# Patient Record
Sex: Female | Born: 1950 | Race: White | Hispanic: Yes | Marital: Married | Smoking: Never smoker
Health system: Southern US, Community
[De-identification: ages and names within clinical notes are randomized; demographics above are authoritative.]

## PROBLEM LIST (undated history)

## (undated) DIAGNOSIS — A048 Other specified bacterial intestinal infections: Secondary | ICD-10-CM

## (undated) DIAGNOSIS — R51 Headache: Secondary | ICD-10-CM

## (undated) DIAGNOSIS — E119 Type 2 diabetes mellitus without complications: Secondary | ICD-10-CM

## (undated) DIAGNOSIS — T4145XA Adverse effect of unspecified anesthetic, initial encounter: Secondary | ICD-10-CM

## (undated) DIAGNOSIS — T8859XA Other complications of anesthesia, initial encounter: Secondary | ICD-10-CM

## (undated) DIAGNOSIS — K219 Gastro-esophageal reflux disease without esophagitis: Secondary | ICD-10-CM

## (undated) DIAGNOSIS — M1990 Unspecified osteoarthritis, unspecified site: Secondary | ICD-10-CM

## (undated) DIAGNOSIS — I10 Essential (primary) hypertension: Secondary | ICD-10-CM

## (undated) DIAGNOSIS — Z9889 Other specified postprocedural states: Secondary | ICD-10-CM

## (undated) DIAGNOSIS — R112 Nausea with vomiting, unspecified: Secondary | ICD-10-CM

## (undated) DIAGNOSIS — G709 Myoneural disorder, unspecified: Secondary | ICD-10-CM

## (undated) HISTORY — PX: TONSILLECTOMY: SUR1361

## (undated) HISTORY — PX: LIPOMA EXCISION: SHX5283

## (undated) HISTORY — PX: CHOLECYSTECTOMY: SHX55

## (undated) HISTORY — PX: ANKLE ARTHROSCOPY: SUR85

## (undated) HISTORY — PX: SHOULDER ARTHROSCOPY: SHX128

## (undated) HISTORY — PX: ABDOMINAL HYSTERECTOMY: SHX81

## (undated) HISTORY — PX: APPENDECTOMY: SHX54

## (undated) HISTORY — PX: SPINAL FUSION: SHX223

---

## 1988-08-10 HISTORY — PX: FUNCTIONAL ENDOSCOPIC SINUS SURGERY: SUR616

## 2010-08-10 DIAGNOSIS — A048 Other specified bacterial intestinal infections: Secondary | ICD-10-CM

## 2010-08-10 HISTORY — DX: Other specified bacterial intestinal infections: A04.8

## 2011-05-07 DIAGNOSIS — D1730 Benign lipomatous neoplasm of skin and subcutaneous tissue of unspecified sites: Secondary | ICD-10-CM | POA: Insufficient documentation

## 2011-05-07 DIAGNOSIS — M549 Dorsalgia, unspecified: Secondary | ICD-10-CM | POA: Insufficient documentation

## 2011-05-07 DIAGNOSIS — J309 Allergic rhinitis, unspecified: Secondary | ICD-10-CM | POA: Insufficient documentation

## 2011-06-10 DIAGNOSIS — M5412 Radiculopathy, cervical region: Secondary | ICD-10-CM | POA: Insufficient documentation

## 2011-06-10 DIAGNOSIS — F40240 Claustrophobia: Secondary | ICD-10-CM | POA: Insufficient documentation

## 2011-06-10 DIAGNOSIS — N951 Menopausal and female climacteric states: Secondary | ICD-10-CM | POA: Insufficient documentation

## 2012-05-06 DIAGNOSIS — E1142 Type 2 diabetes mellitus with diabetic polyneuropathy: Secondary | ICD-10-CM | POA: Insufficient documentation

## 2013-03-22 DIAGNOSIS — R011 Cardiac murmur, unspecified: Secondary | ICD-10-CM | POA: Insufficient documentation

## 2013-03-22 DIAGNOSIS — I10 Essential (primary) hypertension: Secondary | ICD-10-CM | POA: Insufficient documentation

## 2013-09-01 ENCOUNTER — Other Ambulatory Visit: Payer: Self-pay | Admitting: Neurological Surgery

## 2013-09-04 ENCOUNTER — Encounter (HOSPITAL_COMMUNITY): Payer: Self-pay | Admitting: Pharmacy Technician

## 2013-09-04 ENCOUNTER — Other Ambulatory Visit: Payer: Self-pay | Admitting: Neurological Surgery

## 2013-09-06 NOTE — Pre-Procedure Instructions (Signed)
Vanessa Armstrong  09/06/2013   Your procedure is scheduled on:  Thursday, February 5.  Report to [employer] [facility]/ Entrance "A" at 5:30AM.  Call this number if you have problems the morning of surgery: [phone]   Remember:   Do not eat food or drink liquids after midnight Wednesday.   Take these medicines the morning of surgery with A SIP OF WATER: Take if needed:oxyCODONE-acetaminophen (PERCOCET/ROXICET)    Do not wear jewelry, make-up or nail polish.  Do not wear lotions, powders, or perfumes. You may wear deodorant.  Do not shave 48 hours prior to surgery.   Do not bring valuables to the hospital.  [facility] is not responsible for any belongings or valuables.               Contacts, dentures or bridgework may not be worn into surgery.  Leave suitcase in the car. After surgery it may be brought to your room.  For patients admitted to the hospital, discharge time is determined by your treatment team.                Special Instructions: Shower using CHG 2 nights before surgery and the night before surgery.  If you shower the day of surgery use CHG.  Use special wash - you have one bottle of CHG for all showers.  You should use approximately 1/3 of the bottle for each shower.   Please read over the following fact sheets that you were given: Pain Booklet, Coughing and Deep Breathing, Blood Transfusion Information and Surgical Site Infection Prevention

## 2013-09-07 ENCOUNTER — Encounter (HOSPITAL_COMMUNITY)
Admission: RE | Admit: 2013-09-07 | Discharge: 2013-09-07 | Disposition: A | Discharge status: Home / Self Care | Payer: Medicare Other | Source: Ambulatory Visit | Attending: Neurological Surgery | Admitting: Neurological Surgery

## 2013-09-07 ENCOUNTER — Encounter (HOSPITAL_COMMUNITY): Payer: Self-pay

## 2013-09-07 DIAGNOSIS — Z01818 Encounter for other preprocedural examination: Secondary | ICD-10-CM | POA: Insufficient documentation

## 2013-09-07 DIAGNOSIS — Z01810 Encounter for preprocedural cardiovascular examination: Secondary | ICD-10-CM | POA: Insufficient documentation

## 2013-09-07 DIAGNOSIS — Z01812 Encounter for preprocedural laboratory examination: Secondary | ICD-10-CM | POA: Insufficient documentation

## 2013-09-07 DIAGNOSIS — Z01811 Encounter for preprocedural respiratory examination: Secondary | ICD-10-CM | POA: Insufficient documentation

## 2013-09-07 HISTORY — DX: Adverse effect of unspecified anesthetic, initial encounter: T41.45XA

## 2013-09-07 HISTORY — DX: Headache: R51

## 2013-09-07 HISTORY — DX: Other complications of anesthesia, initial encounter: T88.59XA

## 2013-09-07 HISTORY — DX: Essential (primary) hypertension: I10

## 2013-09-07 HISTORY — DX: Nausea with vomiting, unspecified: R11.2

## 2013-09-07 HISTORY — DX: Other specified bacterial intestinal infections: A04.8

## 2013-09-07 HISTORY — DX: Unspecified osteoarthritis, unspecified site: M19.90

## 2013-09-07 HISTORY — DX: Other specified postprocedural states: Z98.890

## 2013-09-07 HISTORY — DX: Gastro-esophageal reflux disease without esophagitis: K21.9

## 2013-09-07 LAB — BASIC METABOLIC PANEL
BUN: 12 mg/dL (ref 6–23)
CALCIUM: 9.4 mg/dL (ref 8.4–10.5)
CO2: 24 meq/L (ref 19–32)
Chloride: 100 mEq/L (ref 96–112)
Creatinine, Ser: 0.5 mg/dL (ref 0.50–1.10)
GFR calc Af Amer: >90 mL/min (ref >90)
GLUCOSE: 194 mg/dL — AB (ref 70–99)
Potassium: 4.7 mEq/L (ref 3.7–5.3)
Sodium: 141 mEq/L (ref 137–147)

## 2013-09-07 LAB — CBC WITH DIFFERENTIAL/PLATELET
BASOS PCT: 0 % (ref 0–1)
Basophils Absolute: 0 10*3/uL (ref 0.0–0.1)
EOS PCT: 1 % (ref 0–5)
Eosinophils Absolute: 0.1 10*3/uL (ref 0.0–0.7)
HCT: 41.9 % (ref 36.0–46.0)
Hemoglobin: 15.2 g/dL — ABNORMAL HIGH (ref 12.0–15.0)
LYMPHS ABS: 2.5 10*3/uL (ref 0.7–4.0)
Lymphocytes Relative: 44 % (ref 12–46)
MCH: 29.6 pg (ref 26.0–34.0)
MCHC: 36.3 g/dL — ABNORMAL HIGH (ref 30.0–36.0)
MCV: 81.5 fL (ref 78.0–100.0)
MONOS PCT: 10 % (ref 3–12)
Monocytes Absolute: 0.6 10*3/uL (ref 0.1–1.0)
Neutro Abs: 2.6 10*3/uL (ref 1.7–7.7)
Neutrophils Relative %: 45 % (ref 43–77)
PLATELETS: 238 10*3/uL (ref 150–400)
RBC: 5.14 MIL/uL — ABNORMAL HIGH (ref 3.87–5.11)
RDW: 13.3 % (ref 11.5–15.5)
WBC: 5.8 10*3/uL (ref 4.0–10.5)

## 2013-09-07 LAB — SURGICAL PCR SCREEN
MRSA, PCR: NEGATIVE
Staphylococcus aureus: NEGATIVE

## 2013-09-07 LAB — PROTIME-INR
INR: 0.91 (ref 0.00–1.49)
PROTHROMBIN TIME: 12.1 s (ref 11.6–15.2)

## 2013-09-07 NOTE — Progress Notes (Signed)
Call to [facility] at Dr. Ronnald Ramp office, clarified which orders to use for preop.

## 2013-09-07 NOTE — Progress Notes (Addendum)
Pt. Reports that she was originally seen by Dr. Prince Rome  (cardiac) in [place] - long time ago & Had Holter Monitor, everything was fine. She was sent back to that grp. in the past yr. or two when she was started on anti-htn med., just to be reviewed again for good thorough care. She reports that she was switched to Dr. Andria Frames at that time , no changes to her plan of care.

## 2013-09-13 MED ORDER — VANCOMYCIN HCL 10 G IV SOLR
2000.0000 mg | INTRAVENOUS | Status: DC
  Filled 2013-09-13 (×2): qty 2000

## 2013-09-13 MED ORDER — DEXAMETHASONE SODIUM PHOSPHATE 10 MG/ML IJ SOLN
10.0000 mg | INTRAMUSCULAR | Status: DC
  Filled 2013-09-13: qty 1

## 2013-09-13 MED ORDER — DEXAMETHASONE SODIUM PHOSPHATE 10 MG/ML IJ SOLN
10.0000 mg | INTRAMUSCULAR | Status: AC
  Administered 2013-09-14: 10 mg via INTRAVENOUS

## 2013-09-13 MED ORDER — CEFAZOLIN SODIUM-DEXTROSE 2-3 GM-% IV SOLR
2.0000 g | INTRAVENOUS | Status: AC
  Administered 2013-09-14: 2 g via INTRAVENOUS
  Filled 2013-09-13: qty 50

## 2013-09-14 ENCOUNTER — Encounter (HOSPITAL_COMMUNITY)
Admission: RE | Disposition: A | Discharge status: Home / Self Care | Payer: Medicare Other | Source: Ambulatory Visit | Attending: Neurological Surgery

## 2013-09-14 ENCOUNTER — Inpatient Hospital Stay (HOSPITAL_COMMUNITY): Payer: Medicare Other

## 2013-09-14 ENCOUNTER — Inpatient Hospital Stay (HOSPITAL_COMMUNITY)
Admission: RE | Admit: 2013-09-14 | Discharge: 2013-09-15 | DRG: 460 | Disposition: A | Discharge status: Home / Self Care | Payer: Medicare Other | Source: Ambulatory Visit | Attending: Neurological Surgery | Admitting: Neurological Surgery

## 2013-09-14 ENCOUNTER — Encounter (HOSPITAL_COMMUNITY): Payer: Medicare Other | Admitting: Certified Registered Nurse Anesthetist

## 2013-09-14 ENCOUNTER — Inpatient Hospital Stay (HOSPITAL_COMMUNITY): Payer: Medicare Other | Admitting: Certified Registered Nurse Anesthetist

## 2013-09-14 ENCOUNTER — Encounter (HOSPITAL_COMMUNITY): Payer: Self-pay | Admitting: *Deleted

## 2013-09-14 DIAGNOSIS — Z79899 Other long term (current) drug therapy: Secondary | ICD-10-CM

## 2013-09-14 DIAGNOSIS — Z9089 Acquired absence of other organs: Secondary | ICD-10-CM

## 2013-09-14 DIAGNOSIS — G8918 Other acute postprocedural pain: Secondary | ICD-10-CM | POA: Diagnosis not present

## 2013-09-14 DIAGNOSIS — M47817 Spondylosis without myelopathy or radiculopathy, lumbosacral region: Principal | ICD-10-CM | POA: Diagnosis present

## 2013-09-14 DIAGNOSIS — K219 Gastro-esophageal reflux disease without esophagitis: Secondary | ICD-10-CM | POA: Diagnosis present

## 2013-09-14 DIAGNOSIS — I10 Essential (primary) hypertension: Secondary | ICD-10-CM | POA: Diagnosis present

## 2013-09-14 DIAGNOSIS — E119 Type 2 diabetes mellitus without complications: Secondary | ICD-10-CM | POA: Diagnosis present

## 2013-09-14 DIAGNOSIS — Z981 Arthrodesis status: Secondary | ICD-10-CM

## 2013-09-14 DIAGNOSIS — Z888 Allergy status to other drugs, medicaments and biological substances status: Secondary | ICD-10-CM

## 2013-09-14 HISTORY — PX: MAXIMUM ACCESS (MAS)POSTERIOR LUMBAR INTERBODY FUSION (PLIF) 2 LEVEL: SHX6369

## 2013-09-14 HISTORY — DX: Myoneural disorder, unspecified: G70.9

## 2013-09-14 HISTORY — DX: Type 2 diabetes mellitus without complications: E11.9

## 2013-09-14 LAB — GLUCOSE, CAPILLARY
GLUCOSE-CAPILLARY: 224 mg/dL — AB (ref 70–99)
Glucose-Capillary: 126 mg/dL — ABNORMAL HIGH (ref 70–99)
Glucose-Capillary: 255 mg/dL — ABNORMAL HIGH (ref 70–99)

## 2013-09-14 LAB — TYPE AND SCREEN
ABO/RH(D): B NEG
Antibody Screen: NEGATIVE

## 2013-09-14 LAB — ABO/RH: ABO/RH(D): B NEG

## 2013-09-14 SURGERY — FOR MAXIMUM ACCESS (MAS) POSTERIOR LUMBAR INTERBODY FUSION (PLIF) 2 LEVEL
Anesthesia: General | Site: Spine Lumbar

## 2013-09-14 MED ORDER — LIDOCAINE HCL (CARDIAC) 20 MG/ML IV SOLN
INTRAVENOUS | Status: AC
  Filled 2013-09-14: qty 10

## 2013-09-14 MED ORDER — LISINOPRIL-HYDROCHLOROTHIAZIDE 10-12.5 MG PO TABS: 0.5000 | ORAL_TABLET | Freq: Every day | ORAL | Status: DC

## 2013-09-14 MED ORDER — ACETAMINOPHEN 650 MG RE SUPP: 650.0000 mg | RECTAL | Status: DC | PRN

## 2013-09-14 MED ORDER — PROPOFOL 10 MG/ML IV BOLUS
INTRAVENOUS | Status: AC
  Filled 2013-09-14: qty 20

## 2013-09-14 MED ORDER — LISINOPRIL 5 MG PO TABS
5.0000 mg | ORAL_TABLET | Freq: Every day | ORAL | Status: DC
  Administered 2013-09-14: 5 mg via ORAL
  Filled 2013-09-14 (×2): qty 1

## 2013-09-14 MED ORDER — PROPOFOL 10 MG/ML IV BOLUS
INTRAVENOUS | Status: DC | PRN
  Administered 2013-09-14: 200 mg via INTRAVENOUS

## 2013-09-14 MED ORDER — SUCCINYLCHOLINE CHLORIDE 20 MG/ML IJ SOLN
INTRAMUSCULAR | Status: DC | PRN
  Administered 2013-09-14: 100 mg via INTRAVENOUS

## 2013-09-14 MED ORDER — SODIUM CHLORIDE 0.9 % IR SOLN
Status: DC | PRN
  Administered 2013-09-14: 09:00:00

## 2013-09-14 MED ORDER — GABAPENTIN 300 MG PO CAPS
300.0000 mg | ORAL_CAPSULE | Freq: Every day | ORAL | Status: DC
  Administered 2013-09-14: 300 mg via ORAL
  Filled 2013-09-14 (×2): qty 1

## 2013-09-14 MED ORDER — SENNA 8.6 MG PO TABS
1.0000 | ORAL_TABLET | Freq: Two times a day (BID) | ORAL | Status: DC
  Administered 2013-09-14: 8.6 mg via ORAL
  Filled 2013-09-14 (×3): qty 1

## 2013-09-14 MED ORDER — METHOCARBAMOL 500 MG PO TABS
ORAL_TABLET | ORAL | Status: AC
Start: 2013-09-14 — End: 2013-09-15
  Filled 2013-09-14: qty 1

## 2013-09-14 MED ORDER — BUPIVACAINE HCL (PF) 0.25 % IJ SOLN
INTRAMUSCULAR | Status: DC | PRN
  Administered 2013-09-14: 10 mL
  Administered 2013-09-14: 2 mL

## 2013-09-14 MED ORDER — METOCLOPRAMIDE HCL 5 MG/ML IJ SOLN
INTRAMUSCULAR | Status: AC
  Filled 2013-09-14: qty 2

## 2013-09-14 MED ORDER — FENTANYL CITRATE 0.05 MG/ML IJ SOLN
INTRAMUSCULAR | Status: AC
  Filled 2013-09-14: qty 5

## 2013-09-14 MED ORDER — POTASSIUM CHLORIDE IN NACL 20-0.9 MEQ/L-% IV SOLN
INTRAVENOUS | Status: DC
  Filled 2013-09-14 (×3): qty 1000

## 2013-09-14 MED ORDER — METFORMIN HCL ER 500 MG PO TB24
1000.0000 mg | ORAL_TABLET | Freq: Two times a day (BID) | ORAL | Status: DC
  Administered 2013-09-14 – 2013-09-15 (×2): 1000 mg via ORAL
  Filled 2013-09-14 (×4): qty 2

## 2013-09-14 MED ORDER — METOCLOPRAMIDE HCL 5 MG/ML IJ SOLN
INTRAMUSCULAR | Status: DC | PRN
  Administered 2013-09-14: 5 mg via INTRAVENOUS

## 2013-09-14 MED ORDER — SODIUM CHLORIDE 0.9 % IV SOLN
INTRAVENOUS | Status: DC | PRN
  Administered 2013-09-14: 12:00:00 via INTRAVENOUS

## 2013-09-14 MED ORDER — ARTIFICIAL TEARS OP OINT
TOPICAL_OINTMENT | OPHTHALMIC | Status: AC
  Filled 2013-09-14: qty 3.5

## 2013-09-14 MED ORDER — SODIUM CHLORIDE 0.9 % IJ SOLN: 3.0000 mL | INTRAMUSCULAR | Status: DC | PRN

## 2013-09-14 MED ORDER — LACTATED RINGERS IV SOLN
INTRAVENOUS | Status: DC | PRN
  Administered 2013-09-14 (×3): via INTRAVENOUS

## 2013-09-14 MED ORDER — SODIUM CHLORIDE 0.9 % IJ SOLN
3.0000 mL | Freq: Two times a day (BID) | INTRAMUSCULAR | Status: DC
  Administered 2013-09-14: 3 mL via INTRAVENOUS

## 2013-09-14 MED ORDER — HYDROMORPHONE HCL PF 1 MG/ML IJ SOLN
0.2500 mg | INTRAMUSCULAR | Status: DC | PRN
  Administered 2013-09-14 (×2): 0.5 mg via INTRAVENOUS

## 2013-09-14 MED ORDER — OXYCODONE-ACETAMINOPHEN 5-325 MG PO TABS
1.0000 | ORAL_TABLET | ORAL | Status: DC | PRN
  Administered 2013-09-14 (×2): 2 via ORAL
  Administered 2013-09-15 (×2): 1 via ORAL
  Filled 2013-09-14 (×2): qty 1
  Filled 2013-09-14 (×2): qty 2

## 2013-09-14 MED ORDER — MORPHINE SULFATE 2 MG/ML IJ SOLN
1.0000 mg | INTRAMUSCULAR | Status: DC | PRN
  Administered 2013-09-14: 2 mg via INTRAVENOUS
  Filled 2013-09-14: qty 1

## 2013-09-14 MED ORDER — ONDANSETRON HCL 4 MG/2ML IJ SOLN
INTRAMUSCULAR | Status: DC | PRN
  Administered 2013-09-14 (×2): 4 mg via INTRAVENOUS

## 2013-09-14 MED ORDER — THROMBIN 20000 UNITS EX SOLR
CUTANEOUS | Status: DC | PRN
  Administered 2013-09-14: 09:00:00 via TOPICAL

## 2013-09-14 MED ORDER — 0.9 % SODIUM CHLORIDE (POUR BTL) OPTIME
TOPICAL | Status: DC | PRN
  Administered 2013-09-14: 1000 mL

## 2013-09-14 MED ORDER — HYDROCHLOROTHIAZIDE 12.5 MG PO CAPS
12.5000 mg | ORAL_CAPSULE | Freq: Every day | ORAL | Status: DC
  Filled 2013-09-14: qty 1

## 2013-09-14 MED ORDER — METHOCARBAMOL 100 MG/ML IJ SOLN
500.0000 mg | Freq: Four times a day (QID) | INTRAMUSCULAR | Status: DC | PRN
  Filled 2013-09-14: qty 5

## 2013-09-14 MED ORDER — PROPOFOL INFUSION 10 MG/ML OPTIME
INTRAVENOUS | Status: DC | PRN
  Administered 2013-09-14: 75 ug/kg/min via INTRAVENOUS

## 2013-09-14 MED ORDER — MIDAZOLAM HCL 5 MG/5ML IJ SOLN
INTRAMUSCULAR | Status: DC | PRN
  Administered 2013-09-14: 2 mg via INTRAVENOUS

## 2013-09-14 MED ORDER — SODIUM CHLORIDE 0.9 % IV SOLN: 250.0000 mL | INTRAVENOUS | Status: DC

## 2013-09-14 MED ORDER — EPHEDRINE SULFATE 50 MG/ML IJ SOLN
INTRAMUSCULAR | Status: AC
  Filled 2013-09-14: qty 1

## 2013-09-14 MED ORDER — HYDROMORPHONE HCL PF 1 MG/ML IJ SOLN
INTRAMUSCULAR | Status: AC
  Filled 2013-09-14: qty 1

## 2013-09-14 MED ORDER — ONDANSETRON HCL 4 MG/2ML IJ SOLN
4.0000 mg | INTRAMUSCULAR | Status: DC | PRN
  Administered 2013-09-14: 4 mg via INTRAVENOUS
  Filled 2013-09-14: qty 2

## 2013-09-14 MED ORDER — ARTIFICIAL TEARS OP OINT
TOPICAL_OINTMENT | OPHTHALMIC | Status: DC | PRN
  Administered 2013-09-14: 1 via OPHTHALMIC

## 2013-09-14 MED ORDER — PHENOL 1.4 % MT LIQD
1.0000 | OROMUCOSAL | Status: DC | PRN
Start: 2013-09-14 — End: 2013-09-15

## 2013-09-14 MED ORDER — ACETAMINOPHEN 325 MG PO TABS: 650.0000 mg | ORAL_TABLET | ORAL | Status: DC | PRN

## 2013-09-14 MED ORDER — ONDANSETRON HCL 4 MG/2ML IJ SOLN
INTRAMUSCULAR | Status: AC
  Filled 2013-09-14: qty 2

## 2013-09-14 MED ORDER — THROMBIN 5000 UNITS EX SOLR
OROMUCOSAL | Status: DC | PRN
  Administered 2013-09-14: 09:00:00 via TOPICAL

## 2013-09-14 MED ORDER — CEFAZOLIN SODIUM 1-5 GM-% IV SOLN
1.0000 g | Freq: Three times a day (TID) | INTRAVENOUS | Status: AC
  Administered 2013-09-14 (×2): 1 g via INTRAVENOUS
  Filled 2013-09-14 (×2): qty 50

## 2013-09-14 MED ORDER — MENTHOL 3 MG MT LOZG
1.0000 | LOZENGE | OROMUCOSAL | Status: DC | PRN
Start: 2013-09-14 — End: 2013-09-15

## 2013-09-14 MED ORDER — LISINOPRIL 10 MG PO TABS
10.0000 mg | ORAL_TABLET | Freq: Every day | ORAL | Status: DC
  Filled 2013-09-14: qty 1

## 2013-09-14 MED ORDER — NITROGLYCERIN 0.4 MG SL SUBL: 0.4000 mg | SUBLINGUAL_TABLET | SUBLINGUAL | Status: DC | PRN

## 2013-09-14 MED ORDER — METHOCARBAMOL 500 MG PO TABS
500.0000 mg | ORAL_TABLET | Freq: Four times a day (QID) | ORAL | Status: DC | PRN
Start: 2013-09-14 — End: 2013-09-15
  Administered 2013-09-14 – 2013-09-15 (×3): 500 mg via ORAL
  Filled 2013-09-14 (×2): qty 1

## 2013-09-14 MED ORDER — ROCURONIUM BROMIDE 50 MG/5ML IV SOLN
INTRAVENOUS | Status: AC
  Filled 2013-09-14: qty 1

## 2013-09-14 MED ORDER — MIDAZOLAM HCL 2 MG/2ML IJ SOLN
INTRAMUSCULAR | Status: AC
  Filled 2013-09-14: qty 2

## 2013-09-14 MED ORDER — FENTANYL CITRATE 0.05 MG/ML IJ SOLN
INTRAMUSCULAR | Status: DC | PRN
  Administered 2013-09-14: 50 ug via INTRAVENOUS
  Administered 2013-09-14: 150 ug via INTRAVENOUS
  Administered 2013-09-14 (×4): 50 ug via INTRAVENOUS
  Administered 2013-09-14: 100 ug via INTRAVENOUS
  Administered 2013-09-14 (×2): 50 ug via INTRAVENOUS

## 2013-09-14 MED ORDER — ONDANSETRON HCL 4 MG/2ML IJ SOLN: 4.0000 mg | Freq: Once | INTRAMUSCULAR | Status: DC | PRN

## 2013-09-14 MED ORDER — LIDOCAINE HCL (CARDIAC) 20 MG/ML IV SOLN
INTRAVENOUS | Status: DC | PRN
  Administered 2013-09-14: 80 mg via INTRAVENOUS

## 2013-09-14 MED ORDER — HYDROCHLOROTHIAZIDE 10 MG/ML ORAL SUSPENSION
6.2500 mg | Freq: Every day | ORAL | Status: DC
  Administered 2013-09-14: 6.25 mg via ORAL
  Filled 2013-09-14 (×2): qty 1.25

## 2013-09-14 MED ORDER — STERILE WATER FOR INJECTION IJ SOLN
INTRAMUSCULAR | Status: AC
  Filled 2013-09-14: qty 10

## 2013-09-14 SURGICAL SUPPLY — 68 items
BAG DECANTER FOR FLEXI CONT (MISCELLANEOUS) ×3 IMPLANT
BENZOIN TINCTURE PRP APPL 2/3 (GAUZE/BANDAGES/DRESSINGS) ×3 IMPLANT
BLADE SURG ROTATE 9660 (MISCELLANEOUS) IMPLANT
BONE MATRIX OSTEOCEL PRO MED (Bone Implant) ×3 IMPLANT
BUR MATCHSTICK NEURO 3.0 LAGG (BURR) ×3 IMPLANT
CAGE COROENT LG 10X9X23-12 (Cage) ×6 IMPLANT
CAGE COROENT MP 8X23 (Cage) ×6 IMPLANT
CANISTER SUCT 3000ML (MISCELLANEOUS) ×3 IMPLANT
CLIP NEUROVISION LG (CLIP) ×3 IMPLANT
CLOSURE WOUND 1/2 X4 (GAUZE/BANDAGES/DRESSINGS) ×2
CONT SPEC 4OZ CLIKSEAL STRL BL (MISCELLANEOUS) ×6 IMPLANT
COVER BACK TABLE 24X17X13 BIG (DRAPES) IMPLANT
COVER TABLE BACK 60X90 (DRAPES) ×3 IMPLANT
DRAPE C-ARM 42X72 X-RAY (DRAPES) ×6 IMPLANT
DRAPE C-ARMOR (DRAPES) ×3 IMPLANT
DRAPE LAPAROTOMY 100X72X124 (DRAPES) ×3 IMPLANT
DRAPE POUCH INSTRU U-SHP 10X18 (DRAPES) ×3 IMPLANT
DRAPE SURG 17X23 STRL (DRAPES) ×3 IMPLANT
DRESSING TELFA 8X3 (GAUZE/BANDAGES/DRESSINGS) IMPLANT
DRSG OPSITE 4X5.5 SM (GAUZE/BANDAGES/DRESSINGS) ×3 IMPLANT
DRSG OPSITE POSTOP 4X8 (GAUZE/BANDAGES/DRESSINGS) ×3 IMPLANT
DURAPREP 26ML APPLICATOR (WOUND CARE) ×3 IMPLANT
ELECT REM PT RETURN 9FT ADLT (ELECTROSURGICAL) ×3
ELECTRODE REM PT RTRN 9FT ADLT (ELECTROSURGICAL) ×1 IMPLANT
EVACUATOR 1/8 PVC DRAIN (DRAIN) ×3 IMPLANT
GAUZE SPONGE 4X4 16PLY XRAY LF (GAUZE/BANDAGES/DRESSINGS) IMPLANT
GLOVE BIO SURGEON STRL SZ8 (GLOVE) ×9 IMPLANT
GLOVE BIO SURGEON STRL SZ8.5 (GLOVE) ×3 IMPLANT
GLOVE BIOGEL PI IND STRL 7.0 (GLOVE) ×2 IMPLANT
GLOVE BIOGEL PI INDICATOR 7.0 (GLOVE) ×4
GLOVE SS BIOGEL STRL SZ 6.5 (GLOVE) ×3 IMPLANT
GLOVE SS BIOGEL STRL SZ 8 (GLOVE) ×1 IMPLANT
GLOVE SUPERSENSE BIOGEL SZ 6.5 (GLOVE) ×6
GLOVE SUPERSENSE BIOGEL SZ 8 (GLOVE) ×2
GOWN BRE IMP SLV AUR LG STRL (GOWN DISPOSABLE) ×3 IMPLANT
GOWN BRE IMP SLV AUR XL STRL (GOWN DISPOSABLE) ×6 IMPLANT
GOWN STRL REIN 2XL LVL4 (GOWN DISPOSABLE) IMPLANT
HEMOSTAT POWDER KIT SURGIFOAM (HEMOSTASIS) ×3 IMPLANT
KIT BASIN OR (CUSTOM PROCEDURE TRAY) ×3 IMPLANT
KIT NEEDLE NVM5 EMG ELECT (KITS) ×1 IMPLANT
KIT NEEDLE NVM5 EMG ELECTRODE (KITS) ×2
KIT ROOM TURNOVER OR (KITS) ×3 IMPLANT
MILL MEDIUM DISP (BLADE) ×3 IMPLANT
NEEDLE HYPO 25X1 1.5 SAFETY (NEEDLE) ×3 IMPLANT
NS IRRIG 1000ML POUR BTL (IV SOLUTION) ×3 IMPLANT
PACK LAMINECTOMY NEURO (CUSTOM PROCEDURE TRAY) ×3 IMPLANT
PAD ARMBOARD 7.5X6 YLW CONV (MISCELLANEOUS) ×15 IMPLANT
ROD 60MM LUMBAR (Rod) ×6 IMPLANT
SCREW LOCK (Screw) ×12 IMPLANT
SCREW LOCK FXNS SPNE MAS PL (Screw) ×6 IMPLANT
SCREW MAS PLIF 5.5X30 (Screw) ×4 IMPLANT
SCREW PAS PLIF 5X30 (Screw) ×2 IMPLANT
SCREW SHANK 5.0X30MM (Screw) ×6 IMPLANT
SCREW SHANK 6.5X65 (Screw) ×3 IMPLANT
SCREW TULIP 5.5 (Screw) ×12 IMPLANT
SPONGE LAP 4X18 X RAY DECT (DISPOSABLE) IMPLANT
SPONGE SURGIFOAM ABS GEL 100 (HEMOSTASIS) ×3 IMPLANT
STRIP CLOSURE SKIN 1/2X4 (GAUZE/BANDAGES/DRESSINGS) ×4 IMPLANT
SUT VIC AB 0 CT1 18XCR BRD8 (SUTURE) ×1 IMPLANT
SUT VIC AB 0 CT1 8-18 (SUTURE) ×2
SUT VIC AB 2-0 CP2 18 (SUTURE) ×3 IMPLANT
SUT VIC AB 3-0 SH 8-18 (SUTURE) ×6 IMPLANT
SYR 20ML ECCENTRIC (SYRINGE) ×3 IMPLANT
SYR 3ML LL SCALE MARK (SYRINGE) IMPLANT
TOWEL OR 17X24 6PK STRL BLUE (TOWEL DISPOSABLE) ×3 IMPLANT
TOWEL OR 17X26 10 PK STRL BLUE (TOWEL DISPOSABLE) ×3 IMPLANT
TRAY FOLEY CATH 14FRSI W/METER (CATHETERS) ×3 IMPLANT
WATER STERILE IRR 1000ML POUR (IV SOLUTION) ×3 IMPLANT

## 2013-09-14 NOTE — Progress Notes (Signed)
Utilization review completed.  

## 2013-09-14 NOTE — Anesthesia Procedure Notes (Signed)
Procedure Name: Intubation Date/Time: 09/14/2013 7:48 AM Performed by: [place] Pink Pre-anesthesia Checklist: Patient identified, Timeout performed, Emergency Drugs available, Suction available and Patient being monitored Patient Re-evaluated:Patient Re-evaluated prior to inductionOxygen Delivery Method: Circle system utilized Preoxygenation: Pre-oxygenation with 100% oxygen Intubation Type: IV induction Ventilation: Mask ventilation without difficulty and Oral airway inserted - appropriate to patient size Laryngoscope Size: Mac and 3 Grade View: Grade II Tube type: Oral Tube size: 7.5 mm Number of attempts: 1 Airway Equipment and Method: Stylet and LTA kit utilized Placement Confirmation: ETT inserted through vocal cords under direct vision,  positive ETCO2 and breath sounds checked- equal and bilateral Secured at: 23 cm Tube secured with: Tape Dental Injury: Teeth and Oropharynx as per pre-operative assessment

## 2013-09-14 NOTE — H&P (Signed)
Subjective: Patient is a 63 y.o. female admitted for PLIF L3-4, L4-5. Onset of symptoms was several months ago, gradually worsening since that time.  The pain is rated severe, and is located at the across the lower back and radiates to legs. The pain is described as aching and occurs all day. The symptoms have been progressive. Symptoms are exacerbated by exercise. MRI or CT showed stenosis L4-5, L5-S1. Previous surgery x2 at L5-S1.   Past Medical History  Diagnosis Date  . Complication of anesthesia     woke up slowly- but also terrible nausea, pt. responed   well tio PHENERGAN   . PONV (postoperative nausea and vomiting)   . Hypertension   . GERD (gastroesophageal reflux disease)   . Headache(784.0)     sinus headaches, also reports that headaaches are much impriocved since cervical fusion in 2014   . Arthritis     DDD  . H. pylori infection 2012    treated as outpatient   . Neuromuscular disorder     neuropathy in feet  . Diabetes mellitus without complication     Past Surgical History  Procedure Laterality Date  . Tonsillectomy    . Functional endoscopic sinus surgery  1990  . Appendectomy    . Cholecystectomy    . Abdominal hysterectomy    . Ankle arthroscopy      for bone spurs   . Shoulder arthroscopy Right     for RCT  . Spinal fusion  2008-2014    multiple surgeries- cervical & lumbar , last one- cervical fusion - 2014  . Lipoma excision      multiple - abdominal region    Prior to Admission medications   Medication Sig Start Date End Date Taking? Authorizing Provider  cetirizine (ZYRTEC) 10 MG tablet Take 10 mg by mouth daily as needed for allergies.   Yes Historical Provider, MD  gabapentin (NEURONTIN) 300 MG capsule Take 300 mg by mouth at bedtime.   Yes Historical Provider, MD  lisinopril-hydrochlorothiazide (PRINZIDE,ZESTORETIC) 10-12.5 MG per tablet Take 0.5 tablets by mouth daily.   Yes Historical Provider, MD  metFORMIN (GLUCOPHAGE-XR) 500 MG 24 hr tablet  Take 1,000 mg by mouth 2 (two) times daily.   Yes Historical Provider, MD  Multiple Vitamin (MULTIVITAMIN WITH MINERALS) TABS tablet Take 1 tablet by mouth daily.   Yes Historical Provider, MD  nitroGLYCERIN (NITROSTAT) 0.4 MG SL tablet Place 0.4 mg under the tongue every 5 (five) minutes as needed for chest pain.   Yes Historical Provider, MD  omeprazole (PRILOSEC) 20 MG capsule Take 20 mg by mouth 2 (two) times daily before a meal.   Yes Historical Provider, MD  oxyCODONE-acetaminophen (PERCOCET/ROXICET) 5-325 MG per tablet Take 1 tablet by mouth 4 (four) times daily as needed for severe pain.   Yes Historical Provider, MD  oxyCODONE-acetaminophen (PERCOCET/ROXICET) 5-325 MG per tablet Take by mouth every 4 (four) hours as needed for severe pain (pt. taking 1/2 tab  of 10-325mg ).    Historical Provider, MD   Allergies  Allergen Reactions  . Ambien [Zolpidem] Other (See Comments)    delusions  . Flector [Diclofenac] Other (See Comments)    Patch  -  Headache and dizziness  . Prevpac [Amoxicill-Clarithro-Lansopraz] Rash and Other (See Comments)    Headache    -  Can take medication individual but not combined     History  Substance Use Topics  . Smoking status: Never Smoker   . Smokeless tobacco: Not on file  .  Alcohol Use: Yes     Comment: occasional glass of wine     No family history on file.   Review of Systems  Positive ROS: neg  All other systems have been reviewed and were otherwise negative with the exception of those mentioned in the HPI and as above.  Objective: Vital signs in last 24 hours: Temp:  [98.8 F (37.1 C)] 98.8 F (37.1 C) (02/05 0556) Pulse Rate:  [82] 82 (02/05 0556) Resp:  [16] 16 (02/05 0556) BP: (134)/(63) 134/63 mmHg (02/05 0556) SpO2:  [99 %] 99 % (02/05 0556)  General Appearance: Alert, cooperative, no distress, appears stated age Head: Normocephalic, without obvious abnormality, atraumatic Eyes: PERRL, conjunctiva/corneas clear, EOM's intact     Neck: Supple, symmetrical, trachea midline Back: Symmetric, no curvature, ROM normal, no CVA tenderness Lungs:  respirations unlabored Heart: Regular rate and rhythm Abdomen: Soft, non-tender Extremities: Extremities normal, atraumatic, no cyanosis or edema Pulses: 2+ and symmetric all extremities Skin: Skin color, texture, turgor normal, no rashes or lesions  NEUROLOGIC:   Mental status: Alert and oriented x4,  no aphasia, good attention span, fund of knowledge, and memory Motor Exam - grossly normal Sensory Exam - grossly normal Reflexes: 1+ Coordination - grossly normal Gait - grossly normal Balance - grossly normal Cranial Nerves: I: smell Not tested  II: visual acuity  OS: nl    OD: nl  II: visual fields Full to confrontation  II: pupils Equal, round, reactive to light  III,VII: ptosis None  III,IV,VI: extraocular muscles  Full ROM  V: mastication Normal  V: facial light touch sensation  Normal  V,VII: corneal reflex  Present  VII: facial muscle function - upper  Normal  VII: facial muscle function - lower Normal  VIII: hearing Not tested  IX: soft palate elevation  Normal  IX,X: gag reflex Present  XI: trapezius strength  5/5  XI: sternocleidomastoid strength 5/5  XI: neck flexion strength  5/5  XII: tongue strength  Normal    Data Review Lab Results  Component Value Date   WBC 5.8 09/07/2013   HGB 15.2* 09/07/2013   HCT 41.9 09/07/2013   MCV 81.5 09/07/2013   PLT 238 09/07/2013   Lab Results  Component Value Date   NA 141 09/07/2013   K 4.7 09/07/2013   CL 100 09/07/2013   CO2 24 09/07/2013   BUN 12 09/07/2013   CREATININE 0.50 09/07/2013   GLUCOSE 194* 09/07/2013   Lab Results  Component Value Date   INR 0.91 09/07/2013    Assessment/Plan: Patient admitted for PLIF L4-5, L5-S1. Patient has failed a reasonable attempt at conservative therapy.  I explained the condition and procedure to the patient and answered any questions.  Patient wishes to proceed  with procedure as planned. Understands risks/ benefits and typical outcomes of procedure.   Nayelli Inglis S 09/14/2013 7:41 AM

## 2013-09-14 NOTE — Progress Notes (Signed)
Going to lunch. Giving report to [place] [employer] RN

## 2013-09-14 NOTE — Progress Notes (Signed)
Lunch relief by MA Aleiah Mohammed RN 

## 2013-09-14 NOTE — Transfer of Care (Signed)
Immediate Anesthesia Transfer of Care Note  Patient: Vanessa Armstrong  Procedure(s) Performed: Procedure(s): FOR MAXIMUM ACCESS (MAS) POSTERIOR LUMBAR INTERBODY FUSION (PLIF) 2 LEVEL lumbar L4-5 L5-S1 (N/A)  Patient Location: PACU  Anesthesia Type:General  Level of Consciousness: awake, alert  and oriented  Airway & Oxygen Therapy: Patient Spontanous Breathing and Patient connected to nasal cannula oxygen  Post-op Assessment: Report given to PACU RN, Post -op Vital signs reviewed and stable and Patient moving all extremities X 4  Post vital signs: Reviewed and stable  Complications: No apparent anesthesia complications

## 2013-09-14 NOTE — Op Note (Signed)
09/14/2013  11:48 AM  PATIENT:  Vanessa Armstrong  63 y.o. female  PRE-OPERATIVE DIAGNOSIS:  1. Lumbar degenerative disc disease, 2. Lumbar spinal stenosis, 3. Failed back syndrome, 4. Back and leg pain  POST-OPERATIVE DIAGNOSIS:  Same  PROCEDURE:   1. Decompressive lumbar laminectomy L4-5 and L5-S1 requiring more work than would be required for a simple exposure of the disk for PLIF in order to adequately decompress the neural elements and address the spinal stenosis 2. Posterior lumbar interbody fusion L4-5 and L5-S1 using PEEK interbody cages packed with morcellized allograft and autograft 3. Posterior fixation L4-S1 inclusive using cortical pedicle screws.    SURGEON:  Sherley Bounds, MD  ASSISTANTS: Dr. Arnoldo Morale  ANESTHESIA:  General  EBL: 450 ml  Total I/O In: 2300 [I.V.:2150; Blood:150] Out: 88 [Urine:60; Blood:450]  BLOOD ADMINISTERED:150 CC PRBC  DRAINS: Hemovac   INDICATION FOR PROCEDURE: This patient presented with severe back and leg pain. It had 2 previous spine surgeries at L5-S1. MRI showed stenosis at L4-5 and recurrent disease L5-S1. I commended a 2 level decompression and fusion. Patient understood the risks, benefits, and alternatives and potential outcomes and wished to proceed.  PROCEDURE DETAILS:  The patient was brought to the operating room. After induction of generalized endotracheal anesthesia the patient was rolled into the prone position on chest rolls and all pressure points were padded. The patient's lumbar region was cleaned and then prepped with DuraPrep and draped in the usual sterile fashion. Anesthesia was injected and then a dorsal midline incision was made and carried down to the lumbosacral fascia. The fascia was opened and the paraspinous musculature was taken down in a subperiosteal fashion to expose L4-5 and L5-S1. A self-retaining retractor was placed. Intraoperative fluoroscopy confirmed my level, and I started with placement of the L4 cortical  pedicle screws. The pedicle screw entry zones were identified utilizing surface landmarks and  AP and lateral fluoroscopy. I scored the cortex with the high-speed drill and then used the hand drill and EMG monitoring to drill an upward and outward direction into the pedicle. I then tapped line to line, and the tap was also monitored. I then placed a 5-0 x 30 mm cortical pedicle screw into the pedicles of L4 bilaterally. I then turned my attention to the decompression and the spinous process was removed and complete lumbar laminectomies, hemi- facetectomies, and foraminotomies were performed at L45 and L5-S1. The patient had significant spinal stenosis and this required more work than would be required for a simple exposure of the disc for posterior lumbar interbody fusion. Much more generous decompression was undertaken in order to adequately decompress the neural elements and address the patient's leg pain. The yellow ligament was removed to expose the underlying dura and nerve roots, and generous foraminotomies were performed to adequately decompress the neural elements. There was significant scar tissue at L5-S1 on the right from the previous surgeries. . Or significant overgrown facet at both levels. Both the exiting and traversing nerve roots were decompressed on both sides until a coronary dilator passed easily along the nerve roots. Once the decompression was complete, I turned my attention to the posterior lower lumbar interbody fusion. The epidural venous vasculature was coagulated and cut sharply. Disc space was incised and the initial discectomy was performed with pituitary rongeurs. The disc space was distracted with sequential distractors to a height of 8 mm L5-S1 and 10 mm at L4-5. We then used a series of scrapers and shavers to prepare the endplates for  fusion at both levels. The midline was prepared with Epstein curettes. Once the complete discectomy was finished, we packed an appropriate sized peek  interbody cage with local autograft and morcellized allograft, gently retracted the nerve root, and tapped the cage into position at L4-5 and L5-S1.  The midline between the cages was packed with morselized autograft and allograft. We then turned our attention to the placement of the lower pedicle screws. The pedicle screw entry zones were identified utilizing surface landmarks and fluoroscopy. I drilled into each pedicle utilizing the hand drill and EMG monitoring, and tapped each pedicle with the appropriate tap. We palpated with a ball probe to assure no break in the cortex. We then placed 5-0 by 30 mm pedicle screws into the pedicles bilaterally at L5,  And 6 5 x 35 mm sacral pedicle screws .  We then placed lordotic rods into the multiaxial screw heads of the pedicle screws and locked these in position with the locking caps and anti-torque device. We then checked our construct with AP and lateral fluoroscopy. Irrigated with copious amounts of bacitracin-containing saline solution. Placed a medium Hemovac drain through separate stab incision. Inspected the nerve roots once again to assure adequate decompression, lined to the dura with Gelfoam, and closed the muscle and the fascia with 0 Vicryl. Closed the subcutaneous tissues with 2-0 Vicryl and subcuticular tissues with 3-0 Vicryl. The skin was closed with benzoin and Steri-Strips. Dressing was then applied, the patient was awakened from general anesthesia and transported to the recovery room in stable condition. At the end of the procedure all sponge, needle and instrument counts were correct.   PLAN OF CARE: Admit to inpatient   PATIENT DISPOSITION:  PACU - hemodynamically stable.   Delay start of Pharmacological VTE agent (>24hrs) due to surgical blood loss or risk of bleeding:  yes

## 2013-09-14 NOTE — Anesthesia Postprocedure Evaluation (Signed)
  Anesthesia Post-op Note  Patient: Vanessa Armstrong  Procedure(s) Performed: Procedure(s): FOR MAXIMUM ACCESS (MAS) POSTERIOR LUMBAR INTERBODY FUSION (PLIF) 2 LEVEL lumbar L4-5 L5-S1 (N/A)  Patient Location: PACU  Anesthesia Type:General  Level of Consciousness: awake, oriented, sedated and patient cooperative  Airway and Oxygen Therapy: Patient Spontanous Breathing  Post-op Pain: moderate  Post-op Assessment: Post-op Vital signs reviewed, Patient's Cardiovascular Status Stable, Respiratory Function Stable, Patent Airway, No signs of Nausea or vomiting and Pain level controlled  Post-op Vital Signs: stable  Complications: No apparent anesthesia complications

## 2013-09-14 NOTE — Anesthesia Preprocedure Evaluation (Signed)
Anesthesia Evaluation  Patient identified by MRN, date of birth, ID band Patient awake    Reviewed: Allergy & Precautions, H&P , NPO status , Patient's Chart, lab work & pertinent test results  Airway       Dental   Pulmonary          Cardiovascular hypertension,     Neuro/Psych  Headaches,    GI/Hepatic GERD-  ,  Endo/Other  diabetes, Type 2, Oral Hypoglycemic Agents  Renal/GU      Musculoskeletal   Abdominal   Peds  Hematology   Anesthesia Other Findings   Reproductive/Obstetrics                           Anesthesia Physical Anesthesia Plan  ASA: II  Anesthesia Plan: General   Post-op Pain Management:    Induction: Intravenous  Airway Management Planned: Oral ETT  Additional Equipment:   Intra-op Plan:   Post-operative Plan: Extubation in OR  Informed Consent: I have reviewed the patients History and Physical, chart, labs and discussed the procedure including the risks, benefits and alternatives for the proposed anesthesia with the patient or authorized representative who has indicated his/her understanding and acceptance.     Plan Discussed with:   Anesthesia Plan Comments:         Anesthesia Quick Evaluation

## 2013-09-14 NOTE — Preoperative (Signed)
Beta Blockers   Reason not to administer Beta Blockers:Not Applicable 

## 2013-09-15 LAB — GLUCOSE, CAPILLARY: GLUCOSE-CAPILLARY: 156 mg/dL — AB (ref 70–99)

## 2013-09-15 MED ORDER — OXYCODONE-ACETAMINOPHEN 5-325 MG PO TABS: 1.0000 | ORAL_TABLET | ORAL | Status: DC | PRN

## 2013-09-15 MED ORDER — METHOCARBAMOL 500 MG PO TABS: 500.0000 mg | ORAL_TABLET | Freq: Four times a day (QID) | ORAL | Status: DC | PRN

## 2013-09-15 MED FILL — Heparin Sodium (Porcine) Inj 1000 Unit/ML: INTRAMUSCULAR | Qty: 30 | Status: AC

## 2013-09-15 MED FILL — Sodium Chloride IV Soln 0.9%: INTRAVENOUS | Qty: 2000 | Status: AC

## 2013-09-15 NOTE — Progress Notes (Signed)
Pt doing very well. Pt given D/C instructions with Rx's verbal understanding was given. Pt D/C'd home via wheelchair @ 1120 per MD order. Pt was stable @ D/C and had no other needs. Holli Humbles, RN

## 2013-09-15 NOTE — Discharge Summary (Signed)
Physician Discharge Summary  Patient ID: Vanessa Armstrong MRN: [id number] DOB/AGE: 09-Jan-1951 63 y.o.  Admit date: 09/14/2013 Discharge date: 09/15/2013  Admission Diagnoses: Lumbar spondylosis and stenosis, status post previous L5-S1 discectomy    Discharge Diagnoses: Same   Discharged Condition: good  Hospital Course: The patient was admitted on 09/14/2013 and taken to the operating room where the patient underwent PLIF L4-5 L5-S1. The patient tolerated the procedure well and was taken to the recovery room and then to the floor in stable condition. The hospital course was routine. There were no complications. The wound remained clean dry and intact. Pt had appropriate back soreness. No complaints of leg pain or new N/T/W. The patient remained afebrile with stable vital signs, and tolerated a regular diet. The patient continued to increase activities, and pain was well controlled with oral pain medications.   Consults: None  Significant Diagnostic Studies:  Results for orders placed during the hospital encounter of 09/14/13  GLUCOSE, CAPILLARY      Result Value Range   Glucose-Capillary 126 (*) 70 - 99 mg/dL  GLUCOSE, CAPILLARY      Result Value Range   Glucose-Capillary 224 (*) 70 - 99 mg/dL   Comment 1 Notify RN     Comment 2 Documented in Chart    GLUCOSE, CAPILLARY      Result Value Range   Glucose-Capillary 255 (*) 70 - 99 mg/dL  GLUCOSE, CAPILLARY      Result Value Range   Glucose-Capillary 156 (*) 70 - 99 mg/dL   Comment 1 Notify RN     Comment 2 Documented in Chart    TYPE AND SCREEN      Result Value Range   ABO/RH(D) B NEG     Antibody Screen NEG     Sample Expiration 09/17/2013    ABO/RH      Result Value Range   ABO/RH(D) B NEG      Chest 2 View  09/07/2013   CLINICAL DATA:  Spondylosis, preoperative evaluation.  EXAM: CHEST  2 VIEW  COMPARISON:  None available for comparison at time of study interpretation.  FINDINGS: Cardiomediastinal silhouette is  unremarkable. The lungs are clear without pleural effusions or focal consolidations. Pulmonary vasculature is unremarkable. Trachea projects midline and there is no pneumothorax. Soft tissue planes and included osseous structures are nonsuspicious. Mild degenerative change of the thoracic spine. Status post ACDF. Surgical clips in the abdomen likely reflect cholecystectomy.  IMPRESSION: No acute cardiopulmonary process.   Electronically Signed   By: Awilda Metroourtnay  Bloomer   On: 09/07/2013 12:53   Dg Lumbar Spine 2-3 Views  09/14/2013   CLINICAL DATA:  L4-S1 MAS PLIF.  EXAM: LUMBAR SPINE - 2-3 VIEW  COMPARISON:  Images from outside lumbar MRI [facility] 07[facility]18/2013. No report available.  FINDINGS: Spot PA and lateral fluoroscopic images of the lower lumbar spine are submitted. Five lumbar type vertebral bodies are assumed. These images demonstrate L4 through S1 laminectomy, pedicle screw and interbody fusion. The hardware appears well positioned. No complications are identified.  IMPRESSION: No demonstrated complication following lower lumbar fusion.   Electronically Signed   By: Roxy HorsemanBill  Veazey M.D.   On: 09/14/2013 13:05    Antibiotics:  Anti-infectives   Start     Dose/Rate Route Frequency Ordered Stop   09/14/13 1600  ceFAZolin (ANCEF) IVPB 1 g/50 mL premix     1 g 100 mL/hr over 30 Minutes Intravenous Every 8 hours 09/14/13 1351 09/15/13 0019   09/14/13 0858  bacitracin 50,000 Units in sodium chloride irrigation 0.9 % 500 mL irrigation  Status:  Discontinued       As needed 09/14/13 0859 09/14/13 1151   09/14/13 0600  vancomycin (VANCOCIN) 2,000 mg in sodium chloride 0.9 % 500 mL IVPB  Status:  Discontinued     2,000 mg 250 mL/hr over 120 Minutes Intravenous On call to O.R. 09/13/13 1438 09/14/13 1315   09/14/13 0600  ceFAZolin (ANCEF) IVPB 2 g/50 mL premix     2 g 100 mL/hr over 30 Minutes Intravenous On call to O.R. 09/13/13 1438 09/14/13 0752      Discharge Exam: Blood  pressure 124/78, pulse 99, temperature 97.5 F (36.4 C), temperature source Oral, resp. rate 16, SpO2 96.00%. Neurologic: Grossly normal Incision clean dry and intact  Discharge Medications:     Medication List         cetirizine 10 MG tablet  Commonly known as:  ZYRTEC  Take 10 mg by mouth daily as needed for allergies.     gabapentin 300 MG capsule  Commonly known as:  NEURONTIN  Take 300 mg by mouth at bedtime.     lisinopril-hydrochlorothiazide 10-12.5 MG per tablet  Commonly known as:  PRINZIDE,ZESTORETIC  Take 0.5 tablets by mouth daily.     metFORMIN 500 MG 24 hr tablet  Commonly known as:  GLUCOPHAGE-XR  Take 1,000 mg by mouth 2 (two) times daily.     methocarbamol 500 MG tablet  Commonly known as:  ROBAXIN  Take 1 tablet (500 mg total) by mouth every 6 (six) hours as needed for muscle spasms.     multivitamin with minerals Tabs tablet  Take 1 tablet by mouth daily.     nitroGLYCERIN 0.4 MG SL tablet  Commonly known as:  NITROSTAT  Place 0.4 mg under the tongue every 5 (five) minutes as needed for chest pain.     omeprazole 20 MG capsule  Commonly known as:  PRILOSEC  Take 20 mg by mouth 2 (two) times daily before a meal.     oxyCODONE-acetaminophen 5-325 MG per tablet  Commonly known as:  PERCOCET/ROXICET  Take 1-2 tablets by mouth every 4 (four) hours as needed for severe pain (pt. taking 1/2 tab  of 10-325mg ).        Disposition: Home   Final Dx: PLIF L4-5 L5-S1      Discharge Orders   Future Orders Complete By Expires   Call MD for:  difficulty breathing, headache or visual disturbances  As directed    Call MD for:  persistant nausea and vomiting  As directed    Call MD for:  redness, tenderness, or signs of infection (pain, swelling, redness, odor or green/yellow discharge around incision site)  As directed    Call MD for:  severe uncontrolled pain  As directed    Call MD for:  temperature >100.4  As directed    Diet - low sodium heart healthy   As directed    Discharge instructions  As directed    Comments:     No driving, no prolonged sitting, no twisting or bending, no strenuous activity, and may shower normal   Increase activity slowly  As directed       Follow-up Information   Follow up with Jerica Creegan S, MD. Schedule an appointment as soon as possible for a visit in 2 weeks.   Specialty:  Neurosurgery   Contact information:   [address] [place] [address] [phone]  Signed: Mitra Duling S 09/15/2013, 10:20 AM

## 2013-09-15 NOTE — Progress Notes (Signed)
Occupational Therapy Evaluation Patient Details Name: Vanessa Armstrong MRN: [id number] DOB: 11-03-1950 Today's Date: 09/15/2013 Time: 2409-7353 OT Time Calculation (min): 22 min  OT Assessment / Plan / Recommendation History of present illness Max PLIF L4-5   Clinical Impression   PTA, family assisted pt with ADL due to back pain. Completed education regarding back precautions, AE, compensatory techniques and DME. Pt has adequate assistance for D/C home. Pt ready for D/C when medically stable.    OT Assessment  Patient does not need any further OT services    Follow Up Recommendations  No OT follow up;Supervision/Assistance - 24 hour    Barriers to Discharge      Equipment Recommendations  None recommended by OT    Recommendations for Other Services    Frequency    eval only   Precautions / Restrictions Precautions Precautions: Back Required Braces or Orthoses: Spinal Brace Spinal Brace: Applied in sitting position   Pertinent Vitals/Pain no apparent distress     ADL  Eating/Feeding: Modified independent Grooming: Set up;Supervision/safety Where Assessed - Grooming: Supported standing Upper Body Bathing: Supervision/safety;Set up Where Assessed - Upper Body Bathing: Unsupported sitting Lower Body Bathing: Moderate assistance Where Assessed - Lower Body Bathing: Supported sit to stand Upper Body Dressing: Minimal assistance Where Assessed - Upper Body Dressing: Unsupported sitting Lower Body Dressing: Moderate assistance Where Assessed - Lower Body Dressing: Supported sit to Lobbyist: Magazine features editor Method: Sit to Loss adjuster, chartered: Comfort height toilet Toileting - Water quality scientist and Hygiene: Moderate assistance Where Assessed - Toileting Clothing Manipulation and Hygiene: Sit to stand from 3-in-1 or toilet Transfers/Ambulation Related to ADLs: minguard ADL Comments: family will assist. Educated pt on use of  AE/compensatory techniques/nec DME.    OT Diagnosis:    OT Problem List:   OT Treatment Interventions:     OT Goals(Current goals can be found in the care plan section) Acute Rehab OT Goals Patient Stated Goal: go home OT Goal Formulation:  (eval only)  Visit Information  Last OT Received On: 09/15/13 Assistance Needed: +1 History of Present Illness: Max PLIF L4-5       Prior [place] expects to be discharged to:: Private residence Living Arrangements: Spouse/significant other Available Help at Discharge: Family;Available 24 hours/day Type of Home: House Home Access: Stairs to enter [employer] of Steps: 3 Entrance Stairs-Rails: Right Home Layout: Two level;Bed/bath upstairs Home Equipment: Shower seat - built in;Hand held shower head Prior Function Level of Independence: Needs assistance Gait / Transfers Assistance Needed: min guard ADL's / Homemaking Assistance Needed: family assisted with ADL as needed Communication Communication: No difficulties         Vision/Perception     Solicitor Arousal/Alertness: Awake/alert Behavior During Therapy: Flat affect Overall Cognitive Status: Within Functional Limits for tasks assessed    Extremity/Trunk Assessment Upper Extremity Assessment Upper Extremity Assessment: Overall WFL for tasks assessed Lower Extremity Assessment Lower Extremity Assessment: Defer to PT evaluation Cervical / Trunk Assessment Cervical / Trunk Assessment: Normal     Mobility Bed Mobility Overal bed mobility: Needs Assistance Bed Mobility: Sidelying to Sit Sidelying to sit: Supervision General bed mobility comments: educated pt on bed mobility Transfers Overall transfer level: Needs assistance Transfers: Sit to/from Stand Sit to Stand: Min guard General transfer comment: vc back precautions     Exercise     Balance Balance Overall balance assessment: No apparent balance  deficits (not formally assessed)  End of Session OT - End of Session Equipment Utilized During Treatment: Back brace Activity Tolerance: Patient tolerated treatment well Patient left: in chair;with call bell/phone within reach;with family/visitor present Nurse Communication: Mobility status;Precautions  GO     Keri Tavella,HILLARY 09/15/2013, 9:29 AM Maurie Boettcher, OTR/L  [phone] 09/15/2013

## 2013-09-15 NOTE — Progress Notes (Signed)
Inpatient Diabetes Program Recommendations  AACE/ADA: New Consensus Statement on Inpatient Glycemic Control (2013)  Target Ranges:  Prepandial:   less than 140 mg/dL      Peak postprandial:   less than 180 mg/dL (1-2 hours)      Critically ill patients:  140 - 180 mg/dL   Results for ADDYSON, TRAUB (MRN [id number]) as of 09/15/2013 08:59  Ref. Range 09/14/2013 06:08 09/14/2013 17:26 09/14/2013 21:40 09/15/2013 08:14  Glucose-Capillary Latest Range: 70-99 mg/dL 126 (H) 224 (H) 255 (H) 156 (H)   Diabetes history: DM2 Outpatient Diabetes medications: Metformin 1000 mg BID Current orders for Inpatient glycemic control: Metformin 1000 mg BID  Inpatient Diabetes Program Recommendations Correction (SSI): Please consider ordering CBGs with Novolog correction scale ACHS while inpatient. HgbA1C: Please consider ordering an A1C to evaluate glycemic control over the past 2-3 months.  Thanks, Barnie Alderman, RN, MSN, CCRN Diabetes Coordinator Inpatient Diabetes Program [phone] (Team Pager) [phone] (AP office) [phone] [facility] office)

## 2013-09-15 NOTE — Evaluation (Signed)
Physical Therapy Evaluation Patient Details Name: Vanessa Armstrong MRN: [id number] DOB: 21-Sep-1950 Today's Date: 09/15/2013 Time: 0258-5277 PT Time Calculation (min): 25 min  PT Assessment / Plan / Recommendation History of Present Illness  63 y.o. female admitted to [facility] on 09/14/13 s/p elective max PLIF L4/5.  Pt with h/o previous back surgeries.    Clinical Impression  Pt is POD #1 s/p lumbar surgery.  She is moving slowly, and guarded, but has demonstrated the ability to move well enough to go home with her husband's min assist at d/c.  All education completed and pt/husband showed ability to ascend descend stairs for home access.  No further acute or f/u PT needs at this time.  PT to sign off.    PT Assessment  Patent does not need any further PT services    Follow Up Recommendations  No PT follow up;Supervision for mobility/OOB    Does the patient have the potential to tolerate intense rehabilitation     NA  Barriers to Discharge   None      Equipment Recommendations  None recommended by PT    Recommendations for Other Services   None  Frequency   NA- one time eval and d/c   Precautions / Restrictions Precautions Precautions: Back Precaution Comments: Back precautions, brace use, and lifting restrictions revliewed with pt/husband.  Required Braces or Orthoses: Spinal Brace Spinal Brace: Applied in sitting position;Lumbar corset   Pertinent Vitals/Pain See vitals flow sheet.      Mobility  Bed Mobility Overal bed mobility: Needs Assistance Bed Mobility: Sidelying to Sit;Rolling;Sit to Sidelying Rolling: Supervision Sidelying to sit: Supervision Sit to sidelying: Supervision General bed mobility comments: supervision for safety, reinforced log roll technique to get into/out of bed.  Transfers Overall transfer level: Needs assistance Equipment used: 1 person hand held assist Transfers: Sit to/from Stand Sit to Stand: Min assist General transfer comment: Min assist  to support trunk during transitions.  Husband providing assist.  Ambulation/Gait Ambulation/Gait assistance: Min assist Ambulation Distance (Feet): 150 Feet Assistive device: 1 person hand held assist Gait Pattern/deviations: Step-through pattern;Shuffle Gait velocity: decreased General Gait Details: pt with guarded gait pattern, holding husband's arm for support.   Stairs: Yes Stairs assistance: Min assist Stair Management: One rail Left;Step to pattern;Forwards Number of Stairs: 9 General stair comments: Verbal cues to pt and husband re: safest leg sequencing (up with stronger, down with weaker).      Exercises Other Exercises Other Exercises: encouraged walking as main form of exercise, no sitting or standing still longer than 30-45 min at a time before moving       PT Goals(Current goals can be found in the care plan section) Acute Rehab PT Goals Patient Stated Goal: go home PT Goal Formulation: No goals set, d/c therapy  Visit Information  Last PT Received On: 09/15/13 Assistance Needed: +1 History of Present Illness: 63 y.o. female admitted to [facility] on 09/14/13 s/p elective max PLIF L4/5.  Pt with h/o previous back surgeries.         Prior [place] expects to be discharged to:: Private residence Living Arrangements: Spouse/significant other (Husband, steve, retired IT trainer) Available Help at Discharge: Family;Available 24 hours/day Type of Home: House Home Access: Stairs to enter [employer] of Steps: 3 Entrance Stairs-Rails: Right Home Layout: Two level;Bed/bath upstairs Alternate Level Stairs-Number of Steps: 14 Alternate Level Stairs-Rails: Left Home Equipment: Shower seat - built in;Hand held shower head Prior Function Level of Independence: Needs assistance  Gait / Transfers Assistance Needed: min guard ADL's / Homemaking Assistance Needed: family assisted with ADL as needed Communication / Swallowing Assistance  Needed: NA- no issues Communication Communication: No difficulties    Cognition  Cognition Arousal/Alertness: Awake/alert Behavior During Therapy: Flat affect Overall Cognitive Status: Within Functional Limits for tasks assessed    Extremity/Trunk Assessment Upper Extremity Assessment Upper Extremity Assessment: Defer to OT evaluation Lower Extremity Assessment Lower Extremity Assessment: Generalized weakness Cervical / Trunk Assessment Cervical / Trunk Assessment: Normal      End of Session PT - End of Session Equipment Utilized During Treatment: Back brace Activity Tolerance: Patient limited by fatigue;Patient limited by pain Patient left: in bed;with call bell/phone within reach;with family/visitor present Nurse Communication: Mobility status    Wells Guiles B. [place], [place], DPT [phone]   09/15/2013, 2:33 PM

## 2013-09-18 ENCOUNTER — Encounter (HOSPITAL_COMMUNITY): Payer: Self-pay | Admitting: Neurological Surgery

## 2013-09-18 LAB — GLUCOSE, CAPILLARY: Glucose-Capillary: 157 mg/dL — ABNORMAL HIGH (ref 70–99)

## 2014-02-19 DIAGNOSIS — E119 Type 2 diabetes mellitus without complications: Secondary | ICD-10-CM | POA: Insufficient documentation

## 2014-05-14 ENCOUNTER — Other Ambulatory Visit: Payer: Self-pay | Admitting: Neurological Surgery

## 2014-05-14 DIAGNOSIS — M47816 Spondylosis without myelopathy or radiculopathy, lumbar region: Secondary | ICD-10-CM

## 2014-05-29 ENCOUNTER — Ambulatory Visit (INDEPENDENT_AMBULATORY_CARE_PROVIDER_SITE_OTHER): Payer: Medicare Other

## 2014-05-29 DIAGNOSIS — M47816 Spondylosis without myelopathy or radiculopathy, lumbar region: Secondary | ICD-10-CM

## 2014-05-29 DIAGNOSIS — Z9889 Other specified postprocedural states: Secondary | ICD-10-CM

## 2015-01-14 ENCOUNTER — Telehealth: Payer: Self-pay | Admitting: *Deleted

## 2015-01-14 NOTE — Telephone Encounter (Signed)
Patient notified that we did get the notes.

## 2015-01-16 ENCOUNTER — Encounter: Payer: Self-pay | Admitting: Neurology

## 2015-01-16 ENCOUNTER — Other Ambulatory Visit (INDEPENDENT_AMBULATORY_CARE_PROVIDER_SITE_OTHER): Payer: Medicare Other

## 2015-01-16 ENCOUNTER — Ambulatory Visit (INDEPENDENT_AMBULATORY_CARE_PROVIDER_SITE_OTHER): Payer: Medicare Other | Admitting: Neurology

## 2015-01-16 VITALS — BP 128/72 | HR 100 | Ht 63.0 in | Wt 229.0 lb

## 2015-01-16 DIAGNOSIS — M792 Neuralgia and neuritis, unspecified: Secondary | ICD-10-CM

## 2015-01-16 DIAGNOSIS — G629 Polyneuropathy, unspecified: Secondary | ICD-10-CM

## 2015-01-16 DIAGNOSIS — R202 Paresthesia of skin: Secondary | ICD-10-CM | POA: Diagnosis not present

## 2015-01-16 DIAGNOSIS — E1142 Type 2 diabetes mellitus with diabetic polyneuropathy: Secondary | ICD-10-CM | POA: Diagnosis not present

## 2015-01-16 LAB — VITAMIN B12: Vitamin B-12: >1500 pg/mL — ABNORMAL HIGH (ref 211–911)

## 2015-01-16 LAB — C-REACTIVE PROTEIN: CRP: 0.5 mg/dL (ref 0.5–20.0)

## 2015-01-16 MED ORDER — NORTRIPTYLINE HCL 10 MG PO CAPS: ORAL_CAPSULE | ORAL | Status: AC

## 2015-01-16 MED ORDER — LIDOCAINE 5 % EX OINT: 1.0000 "application " | TOPICAL_OINTMENT | Freq: Two times a day (BID) | CUTANEOUS | Status: DC | PRN

## 2015-01-16 NOTE — Progress Notes (Signed)
Note sent

## 2015-01-16 NOTE — Progress Notes (Signed)
[facility] Clinic Note - Initial Visit   Date: 01/16/2015   Vanessa Armstrong MRN: [id number] DOB: September 23, 1950   Dear Dr. Arsenio Loader:  Thank you for your kind referral of Vanessa Armstrong for consultation of neuropathy. Although her history is well known to you, please allow [place] to reiterate it for the purpose of our medical record. The patient was accompanied to the clinic by husband who also provides collateral information.     History of Present Illness: Vanessa Armstrong is a 64 y.o. 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    Starting 2014, she developed tingling and numbness of the feet which progressively worsened in Spring 2015 because pain intensified and became burning and stabbing pain of her feet and lower legs, worse on the right. She was started on gabapentin 338m TID in December 2015. Her neurologist, Dr. MTressa Busman increased her gabapentin to 8063mfour times daily and Cymbalta 207maily.  She takes oxycodone 10/325 1 tablet q6h.  She ranks her pain as 7/10 constantly.  Pain is improved by cold water.  She is unable to walk very far or do her usual activities because the pain limits her.  Pain also interferes with her sleeping.   She has severe back and bilateral leg pain for which she had two prior spine surgeries at L5-S1.  MRI lumbar spine showed stenosis at L4-5 and recurrent disease at L5-S1 for which she underwent decompression and fusion.  In March 2016, she developed intermittent numbness of the fingers.  No associated weakness or neck pain.  She had EMG performed in January 2016 at [facility] whose results indicated bilateral CTS and mild neuropathy.   Out-side paper records, electronic medical record, and images have been reviewed where  available and summarized as:  XR lumbar spine 09/15/2014:   These images demonstrate L4 through S1 laminectomy, pedicle screw and interbody fusion. The hardware appears well positioned. No complications are identified. IMPRESSION: No demonstrated complication following lower lumbar fusion  EMG 08/16/2014 performed at [facility]:  "Slowed latency of the median nerves consistent with bilateral CTS and slowed velocity of right peroneal nerve consistent with mild neuropathy."   Past Medical History  Diagnosis Date  . Complication of anesthesia     woke up slowly- but also terrible nausea, pt. responed   well tio PHENERGAN   . PONV (postoperative nausea and vomiting)   . Hypertension   . GERD (gastroesophageal reflux disease)   . Headache(784.0)     sinus headaches, also reports that headaaches are much impriocved since cervical fusion in 2014   . Arthritis     DDD  . H. pylori infection 2012    treated as outpatient   . Neuromuscular disorder     neuropathy in feet  . Diabetes mellitus without complication     Past Surgical History  Procedure Laterality Date  . Tonsillectomy    . Functional endoscopic sinus surgery  1990  . Appendectomy    . Cholecystectomy    . Abdominal hysterectomy    . Ankle arthroscopy      for bone spurs   . Shoulder arthroscopy Right     for RCT  . Spinal fusion  2008-2014    multiple surgeries- cervical & lumbar , last one- cervical fusion - 2014  . Lipoma excision  multiple - abdominal region  . Maximum access (mas)posterior lumbar interbody fusion (plif) 2 level N/A 09/14/2013    Procedure: FOR MAXIMUM ACCESS (MAS) POSTERIOR LUMBAR INTERBODY FUSION (PLIF) 2 LEVEL lumbar L4-5 L5-S1;  Surgeon: Eustace Moore, MD;  Location: [facility];  Service: Neurosurgery;  Laterality: N/A;     Medications:  Current Outpatient Prescriptions on File Prior to Visit  Medication Sig Dispense Refill  . cetirizine (ZYRTEC) 10 MG tablet Take 10 mg by  mouth daily as needed for allergies.    Marland Kitchen lisinopril-hydrochlorothiazide (PRINZIDE,ZESTORETIC) 10-12.5 MG per tablet Take 0.5 tablets by mouth daily.    . metFORMIN (GLUCOPHAGE-XR) 500 MG 24 hr tablet Take 1,000 mg by mouth 2 (two) times daily.    . Multiple Vitamin (MULTIVITAMIN WITH MINERALS) TABS tablet Take 1 tablet by mouth daily.    Marland Kitchen omeprazole (PRILOSEC) 20 MG capsule Take 20 mg by mouth daily.     . nitroGLYCERIN (NITROSTAT) 0.4 MG SL tablet Place 0.4 mg under the tongue every 5 (five) minutes as needed for chest pain.     No current facility-administered medications on file prior to visit.    Allergies:  Allergies  Allergen Reactions  . Ambien [Zolpidem] Other (See Comments)    delusions  . Flector [Diclofenac] Other (See Comments)    Patch  -  Headache and dizziness  . Prevpac [Amoxicill-Clarithro-Lansopraz] Rash and Other (See Comments)    Headache    -  Can take medication individual but not combined     Family History: Family History  Problem Relation Age of Onset  . Hypertension Mother   . Gout Mother   . Diabetes Sister   . Prostate cancer Brother   . Neuropathy Sister     Social History: History   Social History  . Marital Status: Married    Spouse Name: N/A  . Number of Children: N/A  . Years of Education: N/A   Occupational History  . Not on file.   Social History Main Topics  . Smoking status: Never Smoker   . Smokeless tobacco: Not on file  . Alcohol Use: 0.0 oz/week    0 Standard drinks or equivalent per week     Comment: glass of wine once a month  . Drug Use: No  . Sexual Activity: Not on file   Other Topics Concern  . 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  She has been on disability since 2013 for her low back pain.          Review of Systems:  CONSTITUTIONAL: No fevers, chills, night sweats, or weight loss.   EYES: No visual changes or  eye pain ENT: No hearing changes.  No history of nose bleeds.   RESPIRATORY: No cough, wheezing and shortness of breath.   CARDIOVASCULAR: Negative for chest pain, and palpitations.   GI: Negative for abdominal discomfort, blood in stools or black stools.  No recent change in bowel habits.   GU:  No history of incontinence.   MUSCLOSKELETAL: +history of joint pain or swelling.  No myalgias.   SKIN: Negative for lesions, rash, and itching.   HEMATOLOGY/ONCOLOGY: Negative for prolonged bleeding, bruising easily, and swollen nodes.  No history of cancer.   ENDOCRINE: Negative for cold or heat intolerance, polydipsia or goiter.   PSYCH:  No depression or anxiety symptoms.   NEURO: As Above.  Vital Signs:  BP 128/72 mmHg  Pulse 100  Ht 5' 3"  (1.6 m)  Wt 229 lb (103.874 kg)  BMI 40.58 kg/m2   General Medical Exam:   General:  Tearful at times, somewhat uncomfortable.   Eyes/ENT: see cranial nerve examination.   Neck: No masses appreciated.  Full range of motion without tenderness.  No carotid bruits. Respiratory:  Clear to auscultation, good air entry bilaterally.   Cardiac:  Regular rate and rhythm, no murmur.   Extremities:  No deformities, edema, or skin discoloration.  Skin:  No rashes or lesions.  Neurological Exam: MENTAL STATUS including orientation to time, place, person, recent and remote memory, attention span and concentration, language, and fund of knowledge is normal.  Speech is not dysarthric.  CRANIAL NERVES: II:  No visual field defects.  Unremarkable fundi.   III-IV-VI: Pupils equal round and reactive to light.  Normal conjugate, extra-ocular eye movements in all directions of gaze.  No nystagmus.  No ptosis.   V:  Normal facial sensation.     VII:  Normal facial symmetry and movements.   VIII:  Normal hearing and vestibular function.   IX-X:  Normal palatal movement.   XI:  Normal shoulder shrug and head rotation.   XII:  Normal tongue strength and range of  motion, no deviation or fasciculation.  MOTOR:  No atrophy, fasciculations or abnormal movements.  No pronator drift.  Tone is normal.    Right Upper Extremity:    Left Upper Extremity:    Deltoid  5/5   Deltoid  5/5   Biceps  5/5   Biceps  5/5   Triceps  5/5   Triceps  5/5   Wrist extensors  5/5   Wrist extensors  5/5   Wrist flexors  5/5   Wrist flexors  5/5   Finger extensors  5/5   Finger extensors  5/5   Finger flexors  5/5   Finger flexors  5/5   Dorsal interossei  5/5   Dorsal interossei  5/5   Abductor pollicis  5/5   Abductor pollicis  5/5   Tone (Ashworth scale)  0  Tone (Ashworth scale)  0   Right Lower Extremity:    Left Lower Extremity:    Hip flexors  5/5   Hip flexors  5/5   Hip extensors  5/5   Hip extensors  5/5   Knee flexors  5/5   Knee flexors  5/5   Knee extensors  5/5   Knee extensors  5/5   Dorsiflexors  5/5   Dorsiflexors  5/5   Plantarflexors  5/5   Plantarflexors  5/5   Toe extensors  5/5   Toe extensors  5/5   Toe flexors  5/5   Toe flexors  5/5   Tone (Ashworth scale)  0  Tone (Ashworth scale)  0   MSRs:  Right                                                                 Left brachioradialis 2+  brachioradialis 2+  biceps 2+  biceps 2+  triceps 2+  triceps 2+  patellar 2+  patellar 2+  ankle jerk 2+  ankle jerk 2+  Hoffman no  Hoffman no  plantar  response down  plantar response down   SENSORY: Reduced vibration at the great toe, reduced pin prick distal to the lower legs bilaterally.  Proprioception intact.  Romberg's sign absent.   COORDINATION/GAIT: Normal finger-to- nose-finger and heel-to-shin.  Intact rapid alternating movements bilaterally.  Able to rise from a chair without using arms.  Gait is slightly wide-based due to body habitus, stable. Tandem and stressed gait intact.    IMPRESSION: Vanessa Armstrong is a 64 year-old female with diabetes mellitus (HbA1c 7.1) and s/p lumbar decompression with redo L4-5 and L5-S1 (2015) presenting for  evaluation bilateral feet painful paresthesias.  Her neurological examination shows a distal predominant small fiber peripheral neuropathy, which is likely why her prior EMG showed normal sensory responses, however the rate what which symptoms progressed it somewhat faster than I would expect for neuropathy.  Therefore, I would like to obtain EMG of the right side to look for polyradiculoneuropathy, especially given the mild demyelinating changes on her prior NCS.    I had extensive discussion with the patient regarding the pathogenesis, etiology, management, and natural course of neuropathy. Neuropathy tends to be slowly progressive.  Neuropathy risk factors include diabetes and vitamin B12 deficiency.  I would like to test for treatable causes of neuropathy.  Regarding the severity of her pain, I offered alternative pain regimens.  We are a non-narcotic prescribing practice, so if she happens to be someone that needs chronic opoid therapy, she will need to see pain management.     PLAN/RECOMMENDATIONS:  1.  Check ESR, CRP, vitamin B12, vitamin B1, vitamin B6, celiac panel, ENA, copper, ceruloplasmin, zinc, SPEP/UPEP with IFE, ACE 2.  EMG of the right arm and leg 3.  Start nortripyline 30m at bedtime for two weeks, then increase to 2 tablets, ok to increase as tolerable. 4.  Start lidocaine ointment to feet twice daily.  Use gloves or cotton when applying 5.  Stop Cymbalta 6.  Return to clinic in 2 months.   The duration of this appointment visit was 50 minutes of face-to-face time with the patient.  Greater than 50% of this time was spent in counseling, explanation of diagnosis, planning of further management, and coordination of care.   Thank you for allowing me to participate in patient's care.  If I can answer any additional questions, I would be pleased to do so.    Sincerely,    Donika K. PPosey Pronto DO

## 2015-01-16 NOTE — Patient Instructions (Addendum)
1.  Start nortripyline 10mg  at bedtime for two weeks, then increase to 2 tablets 2.  Start lidocaine ointment to feet twice daily.  Use gloves or cotton when applying 3.  Stop Cymbalta 4.  Check blood work 5.  EMG of the right arm and leg 6.  Return to clinic in 2 months

## 2015-01-17 ENCOUNTER — Telehealth: Payer: Self-pay | Admitting: *Deleted

## 2015-01-17 LAB — ENA 9 PANEL
Centromere Ab Screen: <1
ENA SM Ab Ser-aCnc: <1
JO-1 ANTIBODY, IGG: NEGATIVE
RIBOSOMAL P PROTEIN AB: NEGATIVE
SCLERODERMA (SCL-70) (ENA) ANTIBODY, IGG: NEGATIVE
SM/RNP: <1
SSA (Ro) (ENA) Antibody, IgG: <1
SSB (La) (ENA) Antibody, IgG: <1

## 2015-01-17 LAB — SEDIMENTATION RATE: Sed Rate: 10 mm/hr (ref 0–22)

## 2015-01-17 LAB — GLIADIN ANTIBODIES, SERUM
GLIADIN IGA: 9 U (ref <20)
GLIADIN IGG: 1 U (ref <20)

## 2015-01-17 LAB — TISSUE TRANSGLUTAMINASE, IGA: TISSUE TRANSGLUTAMINASE AB, IGA: 1 U/mL (ref <4)

## 2015-01-17 NOTE — Telephone Encounter (Signed)
Patient given instructions and agreed with plan.  

## 2015-01-17 NOTE — Telephone Encounter (Signed)
Patient states she thinks she is having a reaction to the Lidocaine you prescribed on yesterday she used it twice yesterday and once today her feet feel very irritated and a heavy burning sensation please advise. Call back number  [phone]

## 2015-01-17 NOTE — Telephone Encounter (Signed)
Please have her stop the medication and wash the area well with water.  Continue nortriptyline as prescribed.

## 2015-01-18 LAB — UIFE/LIGHT CHAINS/TP QN, 24-HR UR
ALBUMIN, U: DETECTED
Alpha 1, Urine: DETECTED — AB
Alpha 2, Urine: DETECTED — AB
Beta, Urine: DETECTED — AB
GAMMA UR: DETECTED — AB
Total Protein, Urine: 9 mg/dL (ref 5–24)

## 2015-01-18 LAB — SPEP & IFE WITH QIG
ALPHA-2-GLOBULIN: 0.8 g/dL (ref 0.5–0.9)
Albumin ELP: 4.4 g/dL (ref 3.8–4.8)
Alpha-1-Globulin: 0.3 g/dL (ref 0.2–0.3)
BETA GLOBULIN: 0.5 g/dL (ref 0.4–0.6)
Beta 2: 0.5 g/dL (ref 0.2–0.5)
Gamma Globulin: 0.7 g/dL — ABNORMAL LOW (ref 0.8–1.7)
IGG (IMMUNOGLOBIN G), SERUM: 643 mg/dL — AB (ref 690–1700)
IgA: 373 mg/dL (ref 69–380)
IgM, Serum: 33 mg/dL — ABNORMAL LOW (ref 52–322)
Total Protein, Serum Electrophoresis: 7.1 g/dL (ref 6.1–8.1)

## 2015-01-18 LAB — CERULOPLASMIN: Ceruloplasmin: 19 mg/dL (ref 18–53)

## 2015-01-18 LAB — RETICULIN ANTIBODIES, IGA W TITER: RETICULIN AB, IGA: NEGATIVE

## 2015-01-19 LAB — ZINC: Zinc: 76 ug/dL (ref 60–130)

## 2015-01-19 LAB — COPPER, SERUM: Copper: 75 ug/dL (ref 70–175)

## 2015-01-19 LAB — VITAMIN B6: VITAMIN B6: 149.5 ng/mL — AB (ref 2.1–21.7)

## 2015-01-19 LAB — VITAMIN B1: VITAMIN B1 (THIAMINE): 21 nmol/L (ref 8–30)

## 2015-01-21 ENCOUNTER — Ambulatory Visit: Payer: Medicare Other | Admitting: Neurology

## 2015-01-22 DIAGNOSIS — L729 Follicular cyst of the skin and subcutaneous tissue, unspecified: Secondary | ICD-10-CM | POA: Insufficient documentation

## 2015-01-23 LAB — ANGIOTENSIN CONVERTING ENZYME: Angiotensin-Converting Enzyme: 17 U/L (ref 8–52)

## 2015-01-29 ENCOUNTER — Ambulatory Visit (INDEPENDENT_AMBULATORY_CARE_PROVIDER_SITE_OTHER): Payer: Medicare Other | Admitting: Neurology

## 2015-01-29 DIAGNOSIS — E1142 Type 2 diabetes mellitus with diabetic polyneuropathy: Secondary | ICD-10-CM | POA: Diagnosis not present

## 2015-01-29 DIAGNOSIS — M792 Neuralgia and neuritis, unspecified: Secondary | ICD-10-CM

## 2015-01-29 DIAGNOSIS — G629 Polyneuropathy, unspecified: Secondary | ICD-10-CM | POA: Diagnosis not present

## 2015-01-29 DIAGNOSIS — R202 Paresthesia of skin: Secondary | ICD-10-CM

## 2015-01-29 NOTE — Procedures (Signed)
[facility]  [address], [address]  [place], [place] [address] Tel: [phone] Fax:  [phone] Test Date:  01/29/2015  Patient: Vanessa Armstrong DOB: 11-22-1950 Physician: Narda Amber, DO  Sex: Female Height: 5\' 3"  Ref Phys: Narda Amber  ID#: [id number] Temp: 32.3 Technician: M. Dean   Patient Complaints: This is a 64 year old female presenting for evaluation of bilateral arm and leg paresthesias and pain.  NCV & EMG Findings: Extensive electrodiagnostic testing of the right upper and lower extremity shows:  1. Right median, ulnar, and radial sensory responses are within normal limits. 2. Right median and ulnar motor responses are within normal limits. 3. Right sural and superficial peroneal sensory responses are absent. 4. Right tibial motor response showed reduced amplitude (3.3 mV). The peroneal motor responses within normal limits.  5. Sparse chronic motor axon loss changes are seen in the muscles below the knee, without accompanied active denervation.  Impression: The electrophysiologic findings are most consistent with a generalized sensorimotor polyneuropathy, axon loss in type, affecting the right lower extremity. There is no evidence of a cervical/lumbosacral radiculopathy.   ___________________________ Narda Amber, DO    Nerve Conduction Studies Anti Sensory Summary Table   Site NR Peak (ms) Norm Peak (ms) P-T Amp (V) Norm P-T Amp  Right Median Anti Sensory (2nd Digit)  Wrist    3.8 <3.8 13.2 >10  Right Radial Anti Sensory (Base 1st Digit)  32.9C  Wrist    2.1 <2.8 30.5 >10  Right Sup Peroneal Anti Sensory (Ant Lat Mall)  32.9C  12 cm NR  <4.6  >3  Right Sural Anti Sensory (Lat Mall)  Calf NR  <4.6  >3  Right Ulnar Anti Sensory (5th Digit)  32.9C  Wrist    2.9 <3.2 31.2 >5   Motor Summary Table   Site NR Onset (ms) Norm Onset (ms) O-P Amp (mV) Norm O-P Amp Site1 Site2 Delta-0 (ms) Dist (cm) Vel (m/s) Norm Vel (m/s)  Right Median  Motor (Abd Poll Brev)  Wrist    3.8 <4.0 7.4 >5 Elbow Wrist 4.0 21.0 53 >50  Elbow    7.8  7.3         Right Peroneal Motor (Ext Dig Brev)  Ankle    3.4 <6.0 4.3 >2.5 B Fib Ankle 6.8 29.0 43 >40  B Fib    10.2  3.9  Poplt B Fib 1.8 10.0 56 >40  Poplt    12.0  3.9         Right Tibial Motor (Abd Hall Brev)  32.9C  Ankle    4.3 <6.0 3.3 >4 Knee Ankle 8.3 39.0 47 >40  Knee    12.6  2.4         Right Ulnar Motor (Abd Dig Minimi)  32.9C  Wrist    2.4 <3.1 8.5 >7 B Elbow Wrist 3.2 18.0 56 >50  B Elbow    5.6  8.5  A Elbow B Elbow 1.4 10.0 71 >50  A Elbow    7.0  8.5          F Wave Studies   NR F-Lat (ms) Lat Norm (ms) L-R F-Lat (ms)  Right Median (Mrkrs) (Abd Poll Brev)  32.9C     25.12 <33   Right Ulnar (Mrkrs) (Abd Dig Min)  32.9C     27.13 <33    H Reflex Studies   NR H-Lat (ms) Lat Norm (ms) L-R H-Lat (ms)  Left Tibial (Gastroc)  32.[place]  30.88 <35 1.22  Right Tibial (Gastroc)  32.9C     29.66 <35 1.22   EMG   Side Muscle Ins Act Fibs Psw Fasc Number Recrt Dur Dur. Amp Amp. Poly Poly. Comment  Right 1stDorInt Nml Nml Nml Nml Nml Nml Nml Nml Nml Nml Nml Nml N/A  Right FlexPolLong Nml Nml Nml Nml Nml Nml Nml Nml Nml Nml Nml Nml N/A  Right Ext Indicis Nml Nml Nml Nml Nml Nml Nml Nml Nml Nml Nml Nml N/A  Right PronatorTeres Nml Nml Nml Nml Nml Nml Nml Nml Nml Nml Nml Nml N/A  Right Biceps Nml Nml Nml Nml Nml Nml Nml Nml Nml Nml Nml Nml N/A  Right Triceps Nml Nml Nml Nml Nml Nml Nml Nml Nml Nml Nml Nml N/A  Right Deltoid Nml Nml Nml Nml Nml Nml Nml Nml Nml Nml Nml Nml N/A  Right AntTibialis Nml Nml Nml Nml 1- Mod-V Some 1+ Few 1+ Nml Nml N/A  Right Gastroc Nml Nml Nml Nml 1- Mod-V Few 1+ Few 1+ Nml Nml N/A  Right Flex Dig Long Nml Nml Nml Nml 1- Mod-V Some 1+ Few 1+ Nml Nml N/A  Right RectFemoris Nml Nml Nml Nml Nml Nml Nml Nml Nml Nml Nml Nml N/A  Right GluteusMed Nml Nml Nml Nml Nml Nml Nml Nml Nml Nml Nml Nml N/A  Right BicepsFemS Nml Nml Nml Nml Nml Nml Nml Nml Nml  Nml Nml Nml N/A      Waveforms:

## 2015-02-04 ENCOUNTER — Telehealth: Payer: Self-pay | Admitting: Neurology

## 2015-02-04 NOTE — Telephone Encounter (Signed)
Called patient back and she would like to know what we are going to do about her pain.  She was upset because she already knows that she has neuropathy and if we are not going to do anything different then she will stay with her doctor in [place].  Please advise.

## 2015-02-04 NOTE — Telephone Encounter (Signed)
Pt returned your call/Dawn CB# [phone]

## 2015-02-05 NOTE — Telephone Encounter (Signed)
To prevent progression of neuropathy, it is very important for her to maintain tight control of her diabetes. Goal of this time is pain management for which we had started new medication at her last visit (nortriptyline 20mg  qhs). If she is tolerating nortriptyline without side effects, she may increase it to 3 tablets at bedtime.  She is welcome to continue with her local providers, if she chooses.  Vanessa K. Posey Pronto, DO

## 2015-02-05 NOTE — Telephone Encounter (Signed)
Please advise 

## 2015-02-06 ENCOUNTER — Other Ambulatory Visit: Payer: Self-pay | Admitting: *Deleted

## 2015-02-07 NOTE — Telephone Encounter (Signed)
I gave patient instructions but she said that she has switched back to cymbalta.  I asked her about pain management or PT and she said that she is already going to pain management.  Since we do not prescribe narcotics these are our options.  Patient then said that she would go back to her other doctor and requested that I mail her a copy of her EMG.

## 2015-02-07 NOTE — Telephone Encounter (Signed)
Noted  

## 2015-02-08 DIAGNOSIS — F1120 Opioid dependence, uncomplicated: Secondary | ICD-10-CM | POA: Insufficient documentation

## 2015-03-21 ENCOUNTER — Ambulatory Visit: Payer: Medicare Other | Admitting: Neurology

## 2015-10-25 DIAGNOSIS — E1142 Type 2 diabetes mellitus with diabetic polyneuropathy: Secondary | ICD-10-CM | POA: Insufficient documentation

## 2015-10-25 DIAGNOSIS — M792 Neuralgia and neuritis, unspecified: Secondary | ICD-10-CM | POA: Insufficient documentation

## 2015-10-25 DIAGNOSIS — R208 Other disturbances of skin sensation: Secondary | ICD-10-CM | POA: Insufficient documentation

## 2015-10-25 DIAGNOSIS — R200 Anesthesia of skin: Secondary | ICD-10-CM | POA: Insufficient documentation

## 2015-11-27 DIAGNOSIS — R072 Precordial pain: Secondary | ICD-10-CM | POA: Insufficient documentation

## 2015-12-04 ENCOUNTER — Other Ambulatory Visit: Payer: Self-pay | Admitting: Neurosurgery

## 2015-12-04 DIAGNOSIS — M5020 Other cervical disc displacement, unspecified cervical region: Secondary | ICD-10-CM

## 2015-12-19 ENCOUNTER — Ambulatory Visit
Admission: RE | Admit: 2015-12-19 | Discharge: 2015-12-19 | Disposition: A | Discharge status: Home / Self Care | Payer: Medicare Other | Source: Ambulatory Visit | Attending: Neurosurgery | Admitting: Neurosurgery

## 2015-12-19 VITALS — BP 146/71 | HR 90

## 2015-12-19 DIAGNOSIS — M5412 Radiculopathy, cervical region: Secondary | ICD-10-CM

## 2015-12-19 DIAGNOSIS — M5020 Other cervical disc displacement, unspecified cervical region: Secondary | ICD-10-CM

## 2015-12-19 MED ORDER — DIAZEPAM 5 MG PO TABS
10.0000 mg | ORAL_TABLET | Freq: Once | ORAL | Status: AC
  Administered 2015-12-19: 10 mg via ORAL

## 2015-12-19 MED ORDER — ONDANSETRON HCL 4 MG/2ML IJ SOLN
4.0000 mg | Freq: Once | INTRAMUSCULAR | Status: AC
  Administered 2015-12-19: 4 mg via INTRAMUSCULAR

## 2015-12-19 MED ORDER — IOPAMIDOL (ISOVUE-M 300) INJECTION 61%
10.0000 mL | Freq: Once | INTRAMUSCULAR | Status: AC | PRN
  Administered 2015-12-19: 10 mL via INTRATHECAL

## 2015-12-19 MED ORDER — MEPERIDINE HCL 100 MG/ML IJ SOLN
100.0000 mg | Freq: Once | INTRAMUSCULAR | Status: AC
  Administered 2015-12-19: 100 mg via INTRAMUSCULAR

## 2015-12-19 MED ORDER — DIAZEPAM 5 MG PO TABS: 10.0000 mg | ORAL_TABLET | Freq: Once | ORAL | Status: DC

## 2015-12-19 NOTE — Progress Notes (Signed)
Patient states she has been off Cymbalta for the past two days.

## 2015-12-19 NOTE — Discharge Instructions (Signed)

## 2017-01-17 IMAGING — XA DG MYELOGRAPHY LUMBAR INJ CERVICAL
13 series · 13 of 13 positions shown · non-contrast
Comparison: MRI of the cervical spine 08/28/2014

CLINICAL DATA: Left upper extremity anterior shoulder pain to the
hand along ventral aspect of the arm. Stiffness in both shoulders.
Headaches at the base of skull. Four previous cervical spine
surgeries.

FLUOROSCOPY TIME:  143.03 uGy*m2
TECHNIQUE: Contiguous axial images were obtained through the Cervical spine
after the intrathecal infusion of infusion. Coronal and sagittal
reconstructions were obtained of the axial image sets.

[Series 1: vasc adipose · 1 of 1 slices shown (1 of 10)]
[im 1/1]
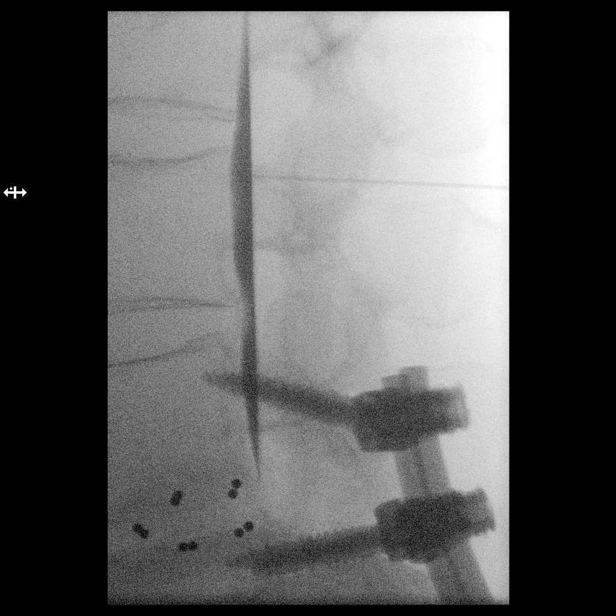

[Series 1: w cervical spine lat · 0.15mm/px · 1 of 1 slices shown]
[im 1/1]
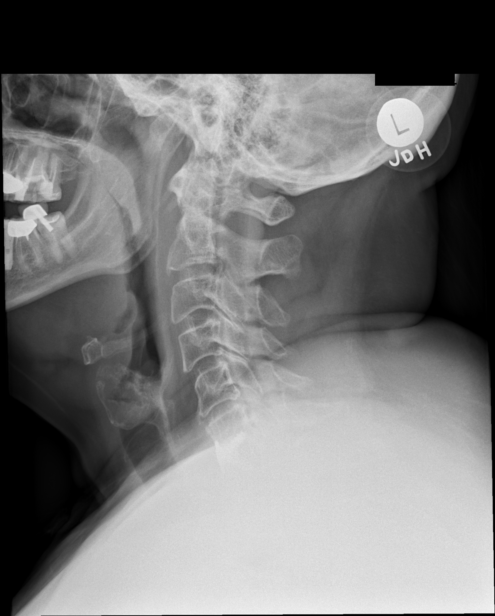

[Series 2: vasc adipose · 1 of 1 slices shown (2 of 10)]
[im 1/1]
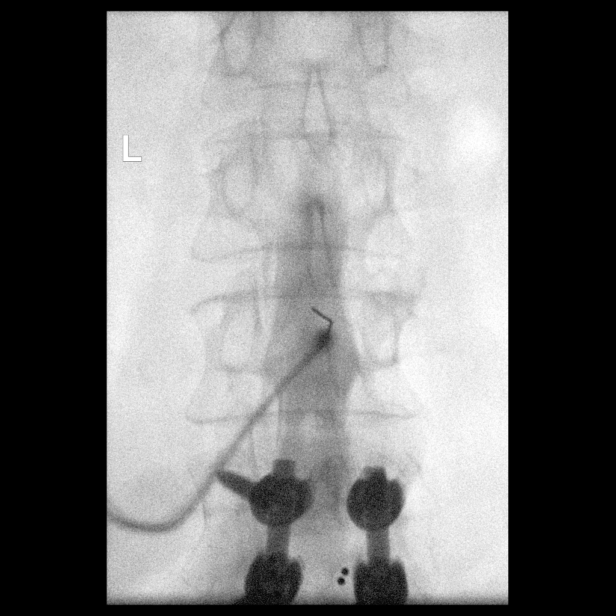

[Series 2: w cervical spine flexion · 0.15mm/px · 1 of 1 slices shown]
[im 1/1]
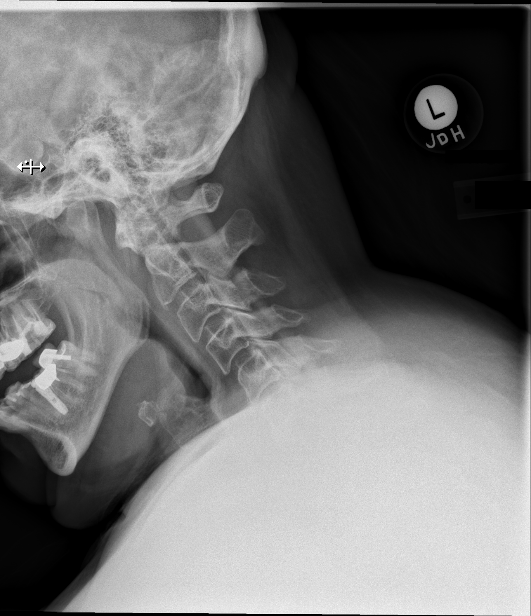

[Series 3: vasc adipose · 1 of 1 slices shown (3 of 10)]
[im 1/1]
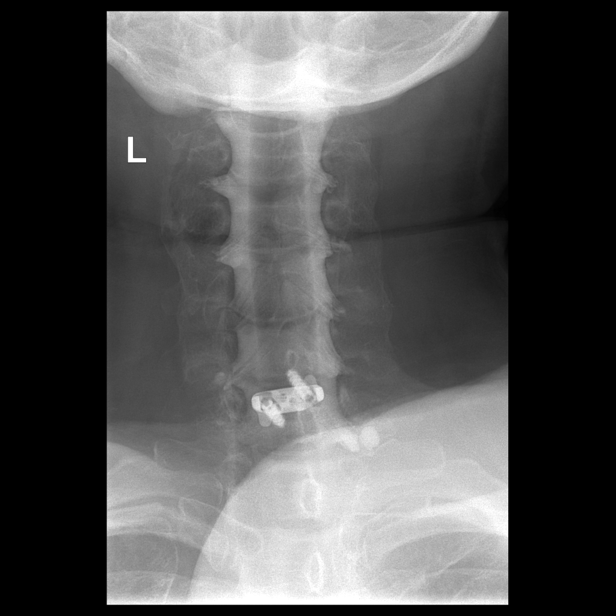

[Series 3: w cervical spine extension · 0.15mm/px · 1 of 1 slices shown]
[im 1/1]
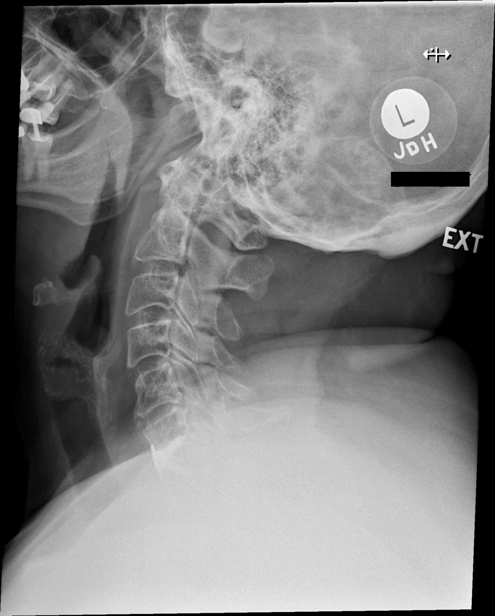

[Series 4: vasc adipose · 1 of 1 slices shown (4 of 10)]
[im 1/1]
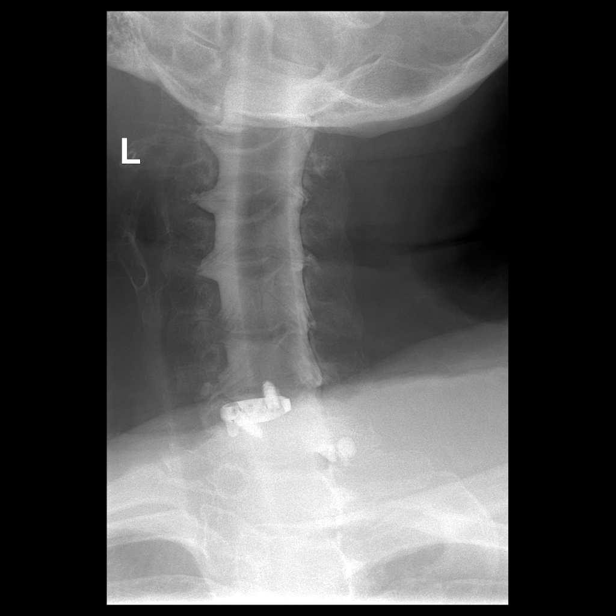

[Series 5: vasc adipose · 1 of 1 slices shown (5 of 10)]
[im 1/1]
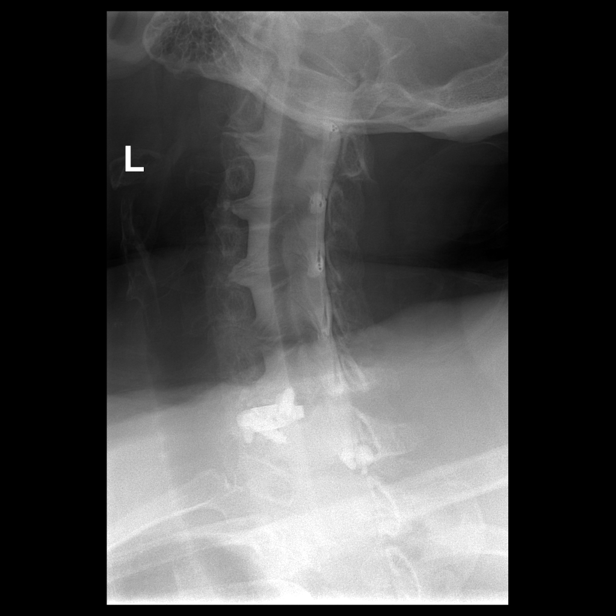

[Series 6: vasc adipose · 1 of 1 slices shown (6 of 10)]
[im 1/1]
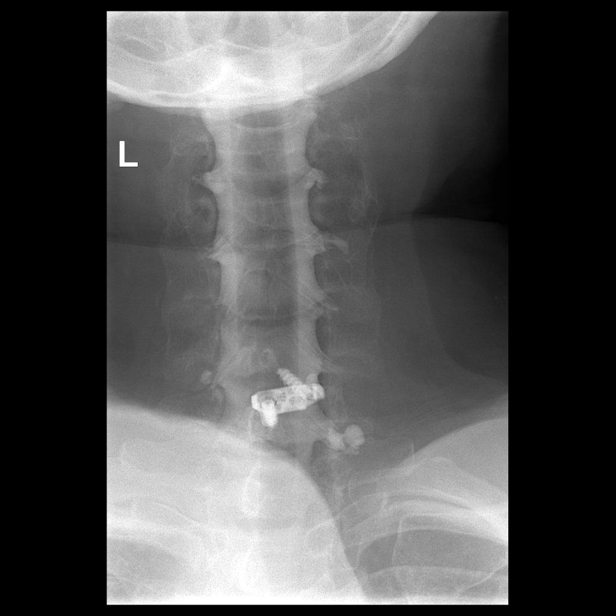

[Series 7: vasc adipose · 1 of 1 slices shown (7 of 10)]
[im 1/1]
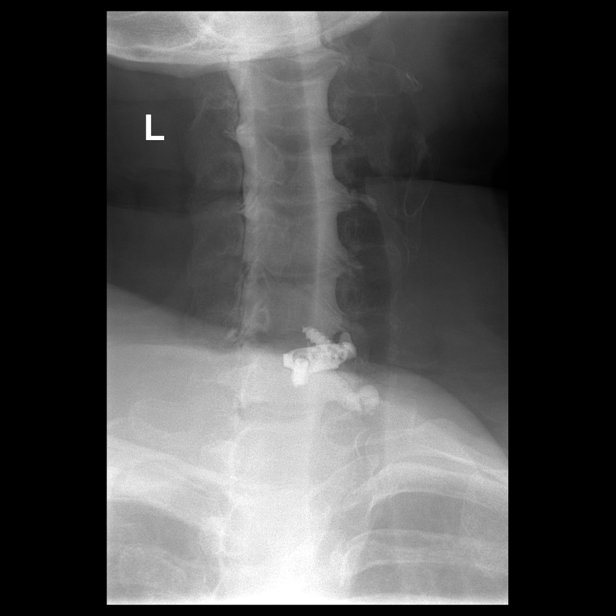

[Series 8: vasc adipose · 1 of 1 slices shown (8 of 10)]
[im 1/1]
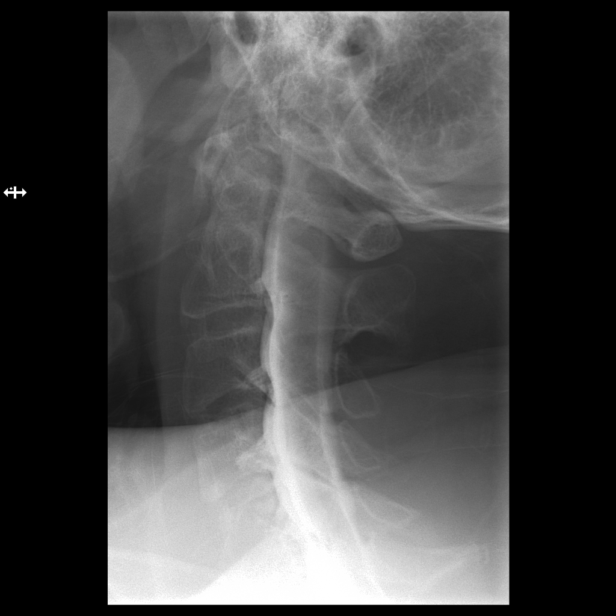

[Series 9: vasc adipose · 1 of 1 slices shown (9 of 10)]
[im 1/1]
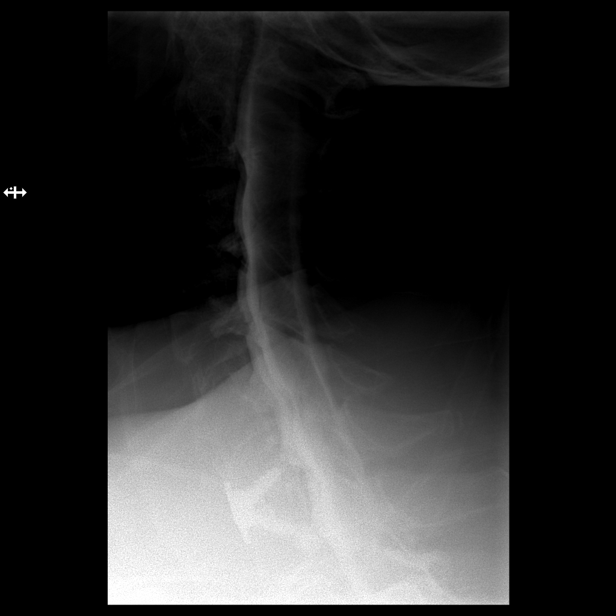

[Series 10: vasc adipose · 1 of 1 slices shown (10 of 10)]
[im 1/1]
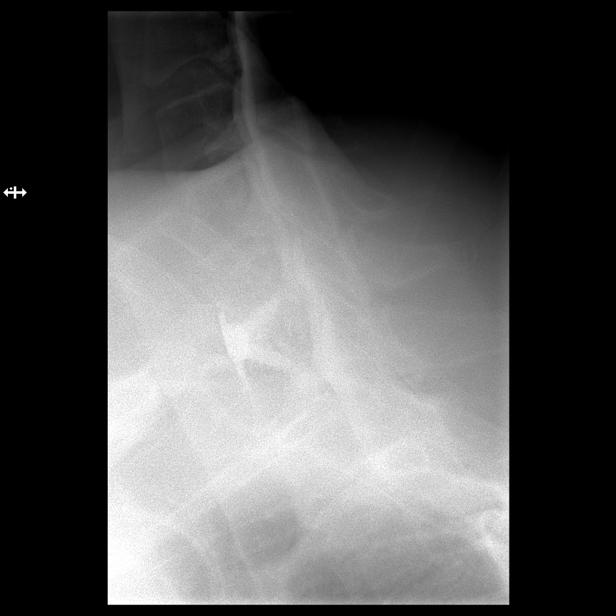

[13 of 13 positions shown; findings below may reference images not displayed]

PROCEDURE:
LUMBAR PUNCTURE FOR CERVICAL MYELOGRAM

After thorough discussion of risks and benefits of the procedure
including bleeding, infection, injury to nerves, blood vessels,
adjacent structures as well as headache and CSF leak, written and
oral informed consent was obtained. Consent was obtained by Dr.
Ebben-Esser Robberts. We discussed the high likelihood of obtaining a
diagnostic study.

Patient was positioned prone on the fluoroscopy table. Local
anesthesia was provided with 1% lidocaine without epinephrine after
prepped and draped in the usual sterile fashion. Puncture was
performed at L2-3 using a 3 1/2 inch 22-gauge spinal needle via a
right paramedian approach. Using a single pass through the dura, the
needle was placed within the thecal sac, with return of clear CSF.
10 mL of Isovue-M 300 was injected into the thecal sac, with normal
opacification of the nerve roots and cauda equina consistent with
free flow within the subarachnoid space. The patient was then moved
to the trendelenburg position and contrast flowed into the Cervical
spine region.

I personally performed the lumbar puncture and administered the
intrathecal contrast. I also personally supervised acquisition of
the myelogram images.
FINDINGS: CERVICAL MYELOGRAM FINDINGS:

Anterior fusion is evident at C6-7. There is significant blunting of
the left C6 nerve root. The nerve roots ***---*** the cervical nerve
roots otherwise fill normally on both. Vertebral body heights
alignment is maintained. There is abnormal motion during flexion
extension. A disc osteophyte complex is evident at C5-6 level.

CT CERVICAL MYELOGRAM FINDINGS:

Cervical spine is imaged from skullbase through T1-2. Anterior
cervical fusion at C6-7 is solid. Hardware place. Craniocervical
junction is within normal limits. A small bone fragment is noted at
the tip of the dens. This likely reflects an os odontoideum. The
soft tissues the neck are unremarkable.

C2-3: Uncovertebral spurring is more prominent right than left. No
significant.

C3-4: A rightward disc osteophyte complex is stable. There is focal
stenosis.

C4-5: A shallow central soft disc protrusion has progressed
slightly. There is partial effacement of ventral CSF. The foramina
are patent bilaterally.

C5-6: A disc osteophyte complex is asymmetric to the left. Severe
left and mild right foraminal stenosis is present. There is partial
effacement the ventral CSF.

C6-7:  Anterior fusion is solid.  No residual stenosis is present.

C7-T1:  Negative.

Lung apices are clear.
IMPRESSION: 1. Solid anterior fusion at C6-7 without residual or recurrent
stenosis.
2. Leftward disc osteophyte complex with severe left and mild right
foraminal stenosis. Mild left central canal narrowing is present.
3. Mild central canal narrowing at C4-5 secondary to a shallow
central soft disc protrusion with slight progression since the prior
study.
